# Patient Record
Sex: Male | Born: 1970 | Race: White | Hispanic: No | Marital: Married | Smoking: Current every day smoker
Health system: Southern US, Community
[De-identification: ages and names within clinical notes are randomized; demographics above are authoritative.]

## PROBLEM LIST (undated history)

## (undated) DIAGNOSIS — M950 Acquired deformity of nose: Secondary | ICD-10-CM

## (undated) DIAGNOSIS — J343 Hypertrophy of nasal turbinates: Secondary | ICD-10-CM

## (undated) DIAGNOSIS — J45909 Unspecified asthma, uncomplicated: Secondary | ICD-10-CM

## (undated) DIAGNOSIS — J342 Deviated nasal septum: Secondary | ICD-10-CM

## (undated) DIAGNOSIS — K219 Gastro-esophageal reflux disease without esophagitis: Secondary | ICD-10-CM

## (undated) DIAGNOSIS — I10 Essential (primary) hypertension: Secondary | ICD-10-CM

## (undated) DIAGNOSIS — Z98811 Dental restoration status: Secondary | ICD-10-CM

## (undated) HISTORY — PX: WISDOM TOOTH EXTRACTION: SHX21

## (undated) HISTORY — DX: Gastro-esophageal reflux disease without esophagitis: K21.9

---

## 2002-07-10 ENCOUNTER — Encounter: Payer: Self-pay | Admitting: Family Medicine

## 2002-07-10 ENCOUNTER — Ambulatory Visit (HOSPITAL_COMMUNITY): Admission: RE | Admit: 2002-07-10 | Discharge: 2002-07-10 | Payer: Self-pay | Admitting: Family Medicine

## 2003-12-11 ENCOUNTER — Ambulatory Visit (HOSPITAL_COMMUNITY): Admission: RE | Admit: 2003-12-11 | Discharge: 2003-12-11 | Payer: Self-pay | Admitting: Family Medicine

## 2010-09-17 ENCOUNTER — Encounter: Payer: Self-pay | Admitting: *Deleted

## 2010-09-17 ENCOUNTER — Inpatient Hospital Stay (HOSPITAL_COMMUNITY)
Admission: EM | Admit: 2010-09-17 | Discharge: 2010-09-18 | DRG: 684 | Disposition: A | Discharge status: Home / Self Care | Payer: 59 | Attending: Internal Medicine | Admitting: Internal Medicine

## 2010-09-17 DIAGNOSIS — T46905A Adverse effect of unspecified agents primarily affecting the cardiovascular system, initial encounter: Secondary | ICD-10-CM | POA: Diagnosis present

## 2010-09-17 DIAGNOSIS — N179 Acute kidney failure, unspecified: Principal | ICD-10-CM | POA: Diagnosis present

## 2010-09-17 DIAGNOSIS — I10 Essential (primary) hypertension: Secondary | ICD-10-CM | POA: Diagnosis present

## 2010-09-17 DIAGNOSIS — N289 Disorder of kidney and ureter, unspecified: Secondary | ICD-10-CM

## 2010-09-17 DIAGNOSIS — E860 Dehydration: Secondary | ICD-10-CM | POA: Diagnosis present

## 2010-09-17 HISTORY — DX: Essential (primary) hypertension: I10

## 2010-09-17 LAB — POCT I-STAT, CHEM 8
BUN: 53 mg/dL — ABNORMAL HIGH (ref 6–23)
Calcium, Ion: 1.08 mmol/L — ABNORMAL LOW (ref 1.12–1.32)
Chloride: 109 mEq/L (ref 96–112)
Creatinine, Ser: 4.5 mg/dL — ABNORMAL HIGH (ref 0.50–1.35)
Glucose, Bld: 101 mg/dL — ABNORMAL HIGH (ref 70–99)
HCT: 50 % (ref 39.0–52.0)
Hemoglobin: 17 g/dL (ref 13.0–17.0)
Potassium: 4.6 mEq/L (ref 3.5–5.1)
Sodium: 139 mEq/L (ref 135–145)
TCO2: 24 mmol/L (ref 0–100)

## 2010-09-17 MED ORDER — ONDANSETRON HCL 4 MG/2ML IJ SOLN
4.0000 mg | Freq: Once | INTRAMUSCULAR | Status: AC
  Administered 2010-09-17: 4 mg via INTRAVENOUS
  Filled 2010-09-17: qty 2

## 2010-09-17 MED ORDER — SODIUM CHLORIDE 0.9 % IV BOLUS (SEPSIS)
1000.0000 mL | Freq: Once | INTRAVENOUS | Status: AC
  Administered 2010-09-17: 1000 mL via INTRAVENOUS

## 2010-09-17 MED ORDER — SODIUM CHLORIDE 0.9 % IV BOLUS (SEPSIS)
1000.0000 mL | Freq: Once | INTRAVENOUS | Status: AC
  Administered 2010-09-18: 1000 mL via INTRAVENOUS

## 2010-09-17 MED ORDER — SODIUM CHLORIDE 0.9 % IV SOLN
Freq: Once | INTRAVENOUS | Status: AC
  Administered 2010-09-18: via INTRAVENOUS

## 2010-09-17 NOTE — ED Notes (Signed)
Pt c/o working in the heat today. Pt c/o sweating profusely, nausea/vomiting all day, and cramping in arms in hands.

## 2010-09-17 NOTE — ED Provider Notes (Signed)
History     Chief Complaint  Patient presents with  . 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 Persistent vomiting with po intake. +cramping pain all over his body. No diarrhea or abd pain.  Patient is a 40 y.o. male presenting with vomiting. The history is provided by the patient.  Emesis  This is a new problem. The current episode started 3 to 5 hours ago. The problem has not changed since onset.The emesis has an appearance of stomach contents (water). There has been no fever. Associated symptoms include myalgias and sweats. Pertinent negatives include no abdominal pain, no chills, no cough, no diarrhea, no fever and no headaches. Risk factors: working outside in high temperatures, new job for pt, he is not used to being in the heat.    Past Medical History  Diagnosis Date  . Hypertension     History reviewed. No pertinent past surgical history.  Family History  Problem Relation Age of Onset  . Hypertension Father     History  Substance Use Topics  . Smoking status: Current Everyday Smoker  . Smokeless tobacco: Not on file  . Alcohol Use: Yes     occ      Review of Systems  Constitutional: Negative for fever and chills.  HENT: Negative for neck pain and neck stiffness.   Eyes: Negative for pain.  Respiratory: Negative for cough and shortness of breath.   Cardiovascular: Positive for chest pain.  Gastrointestinal: Positive for vomiting. Negative for abdominal pain and diarrhea.  Genitourinary: Negative for dysuria.  Musculoskeletal: Positive for myalgias. Negative for back pain.  Skin: Negative for rash.  Neurological: Negative for headaches.  All other systems reviewed and are negative.    Physical Exam  BP 95/63  Pulse 102  Temp(Src) 97.6 F (36.4 C) (Oral)  Resp 20  Ht 6\' 1"  (1.854 m)  Wt 238 lb (107.956 kg)  BMI 31.40  kg/m2  SpO2 97%  Physical Exam  Constitutional: He is oriented to person, place, and time. He appears well-developed and well-nourished.  HENT:  Head: Normocephalic and atraumatic.  Mouth/Throat: Mucous membranes are dry.  Eyes: Conjunctivae and EOM are normal. Pupils are equal, round, and reactive to light.  Neck: Trachea normal. Neck supple. No thyromegaly present.  Cardiovascular: Normal rate, regular rhythm, S1 normal, S2 normal and normal pulses.     No systolic murmur is present   No diastolic murmur is present  Pulses:      Radial pulses are 2+ on the right side, and 2+ on the left side.  Pulmonary/Chest: Effort normal and breath sounds normal. He has no wheezes. He has no rhonchi. He has no rales. He exhibits no tenderness.  Abdominal: Soft. Normal appearance and bowel sounds are normal. There is no tenderness. There is no CVA tenderness and negative Murphy's sign.  Musculoskeletal:       BLE:s Calves nontender, no cords or erythema, negative Homans sign  Neurological: He is alert and oriented to person, place, and time. He has normal strength. No cranial nerve deficit or sensory deficit. GCS eye subscore is 4. GCS verbal subscore is 5. GCS motor subscore is 6.  Skin: Skin is warm and dry. No rash noted. He is not diaphoretic.  Psychiatric: His speech is normal.       Cooperative and appropriate    ED Course  Procedures  MDM Presentation c/w heat exhaustion.   crt sig elevated, IVFs in ED, nausea resolved with zofran and tolerates PO.  MED c/s for admit, cont IVFs  I personally performed the services described in this documentation, which was scribed in my presence. The recorded information has been reviewed and considered.    Written by Enos Fling acting as scribe for Sunnie Nielsen, MD.   Sunnie Nielsen, MD 09/18/10 [phone]

## 2010-09-18 ENCOUNTER — Encounter (HOSPITAL_COMMUNITY): Payer: Self-pay | Admitting: *Deleted

## 2010-09-18 DIAGNOSIS — N179 Acute kidney failure, unspecified: Secondary | ICD-10-CM | POA: Diagnosis present

## 2010-09-18 DIAGNOSIS — E860 Dehydration: Secondary | ICD-10-CM | POA: Diagnosis present

## 2010-09-18 LAB — BASIC METABOLIC PANEL
BUN: 44 mg/dL — ABNORMAL HIGH (ref 6–23)
CO2: 26 mEq/L (ref 19–32)
Calcium: 9.2 mg/dL (ref 8.4–10.5)
Chloride: 102 mEq/L (ref 96–112)
Creatinine, Ser: 3.2 mg/dL — ABNORMAL HIGH (ref 0.50–1.35)
GFR calc Af Amer: 27 mL/min — ABNORMAL LOW (ref >60)
GFR calc non Af Amer: 22 mL/min — ABNORMAL LOW (ref >60)
Glucose, Bld: 114 mg/dL — ABNORMAL HIGH (ref 70–99)
Potassium: 3.9 mEq/L (ref 3.5–5.1)
Sodium: 139 mEq/L (ref 135–145)

## 2010-09-18 LAB — URINALYSIS, ROUTINE W REFLEX MICROSCOPIC
Glucose, UA: NEGATIVE mg/dL
Leukocytes, UA: NEGATIVE
Nitrite: NEGATIVE
Protein, ur: >300 mg/dL — AB
Specific Gravity, Urine: >1.03 — ABNORMAL HIGH (ref 1.005–1.030)
Urobilinogen, UA: 0.2 mg/dL (ref 0.0–1.0)
pH: 5 (ref 5.0–8.0)

## 2010-09-18 LAB — CBC
HCT: 39.5 % (ref 39.0–52.0)
HCT: 43.6 % (ref 39.0–52.0)
Hemoglobin: 13.4 g/dL (ref 13.0–17.0)
Hemoglobin: 14.9 g/dL (ref 13.0–17.0)
MCH: 29.7 pg (ref 26.0–34.0)
MCH: 29.9 pg (ref 26.0–34.0)
MCHC: 33.9 g/dL (ref 30.0–36.0)
MCHC: 34.2 g/dL (ref 30.0–36.0)
MCV: 87 fL (ref 78.0–100.0)
MCV: 88.2 fL (ref 78.0–100.0)
Platelets: 187 10*3/uL (ref 150–400)
Platelets: 222 10*3/uL (ref 150–400)
RBC: 4.48 MIL/uL (ref 4.22–5.81)
RBC: 5.01 MIL/uL (ref 4.22–5.81)
RDW: 13.1 % (ref 11.5–15.5)
RDW: 13.2 % (ref 11.5–15.5)
WBC: 11.5 10*3/uL — ABNORMAL HIGH (ref 4.0–10.5)
WBC: 16.1 10*3/uL — ABNORMAL HIGH (ref 4.0–10.5)

## 2010-09-18 LAB — URINE MICROSCOPIC-ADD ON

## 2010-09-18 LAB — CARDIAC PANEL(CRET KIN+CKTOT+MB+TROPI)
CK, MB: 4.9 ng/mL — ABNORMAL HIGH (ref 0.3–4.0)
Relative Index: 1.6 (ref 0.0–2.5)
Total CK: 303 U/L — ABNORMAL HIGH (ref 7–232)
Troponin I: <0.3 ng/mL (ref <0.30)

## 2010-09-18 LAB — COMPREHENSIVE METABOLIC PANEL
BUN: 37 mg/dL — ABNORMAL HIGH (ref 6–23)
CO2: 25 mEq/L (ref 19–32)
Chloride: 104 mEq/L (ref 96–112)
Creatinine, Ser: 2.29 mg/dL — ABNORMAL HIGH (ref 0.50–1.35)
GFR calc non Af Amer: 32 mL/min — ABNORMAL LOW (ref >60)
Total Bilirubin: 0.4 mg/dL (ref 0.3–1.2)

## 2010-09-18 MED ORDER — SODIUM CHLORIDE 0.9 % IV SOLN
INTRAVENOUS | Status: DC
  Administered 2010-09-18: 11:00:00 via INTRAVENOUS

## 2010-09-18 MED ORDER — SODIUM CHLORIDE 0.9 % IV BOLUS (SEPSIS)
1000.0000 mL | Freq: Once | INTRAVENOUS | Status: AC
  Administered 2010-09-18: 1000 mL via INTRAVENOUS

## 2010-09-18 MED ORDER — SODIUM CHLORIDE 0.9 % IV SOLN
INTRAVENOUS | Status: DC
  Administered 2010-09-18: 03:00:00 via INTRAVENOUS

## 2010-09-18 MED ORDER — PROMETHAZINE HCL 12.5 MG PO TABS: 12.5000 mg | ORAL_TABLET | Freq: Four times a day (QID) | ORAL | Status: DC | PRN

## 2010-09-18 MED ORDER — PROMETHAZINE HCL 25 MG/ML IJ SOLN: 12.5000 mg | Freq: Four times a day (QID) | INTRAMUSCULAR | Status: DC | PRN

## 2010-09-18 MED ORDER — SODIUM CHLORIDE 0.9 % IV SOLN: Freq: Once | INTRAVENOUS | Status: DC

## 2010-09-18 NOTE — H&P (Signed)
[name], [name]              ACCOUNT NO.:  [id number]  MEDICAL RECORD NO.:  [id number]  LOCATION:  A305                          FACILITY:  [facility]  PHYSICIAN:  Tarry Kos, MD       DATE OF BIRTH:  [birth year]  DATE OF ADMISSION:  09/17/2010 DATE OF DISCHARGE:  LH                             HISTORY & PHYSICAL   CHIEF COMPLAINT:  Nausea and vomiting.  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  He came in because earlier this afternoon he has had a significant amount of nausea and vomiting and severe muscle cramps.  He says he has been profusely sweating and over Raymond last several days he has had hardly any urinary output.  He is found to be in acute renal failure and we are being asked to admit [name] because of his renal failure.  He does not have a history of renal failure.  He has not been running any fevers.  He is starting to urinate a little bit, he is on his third liter IV fluids bolus right now in [place] emergency department.  He states he feels much better since he has gotten some fluid, however, his creatinine was 4.5 and obviously due to significant of his renal failure, he will need to be hospitalized probably for at least 24-48 hours.  REVIEW OF SYSTEMS:  Otherwise negative.  PAST MEDICAL HISTORY:  Hypertension.  MEDICATIONS:  Lisinopril.  ALLERGIES:  ASPIRIN and PENICILLIN.  SOCIAL HISTORY:  Negative x3.  PHYSICAL EXAMINATION:  VITAL SIGNS:  Temperature is 97.6, pulse is 89, respiratory rate 20, blood pressure 111/48, and 96% O2 sats on room air. GENERAL:  He is alert and oriented x4.  No apparent stress, cooperative and friendly. HEENT:  Extraocular muscles are intact.  Pupils equal and react to light.  Oropharynx clear.  Mucous membranes  moist. NECK:  No JVD.  No carotid bruits. HEART:  Regular rate and rhythm without murmurs or gallops. CHEST:  Clear to auscultation bilaterally.  No wheezes or rales. ABDOMEN:  Soft, nontender, nondistended.  Positive bowel sounds.  No hepatosplenomegaly. EXTREMITIES:  No clubbing, cyanosis, or edema. PSYCH:  Normal affect. NEURO:  No focal neurologic deficits. SKIN:  No rashes.  LABORATORY DATA:  His BUN and creatinine significantly elevated at 53 and 4.5.  There is no previous one to compare.  Potassium is 4.6, white count is 16.1, hemoglobin is 14.9, platelet count 222.  Urinalysis with a high specific gravity over 1.030 and protein negative for leuks, negative for nitrites, and many bacteria.  ASSESSMENT AND PLAN:  This is a 40 year old male with significant dehydration and acute renal failure. 1. Acute renal failure secondary to prerenal azotemia from combination     of significant dehydration along with medication effects with ACE     inhibitor.  I am going to hold his lisinopril.  I am going to bolus     with total 4 L of IV  fluids of normal saline and then place him on     125 mL an hour and repeat his BUN and creatinine in Raymond morning.     Monitor his urinary output closely.  I suspect he will have     significant improvement in his renal functions in Raymond next 24     hours.  I have advised him that he will likely be here for at least     48 hours depending on what his creatinine does, he understands     this.  I have also explained Raymond extreme importance of staying     hydrated with fluids while working out in [place].  He understands     this, I am also going to add monitor his total CKs.  There has not     been one done yet in Raymond ED to check for any significant     rhabdomyolysis.  [name] is a full code.  Further     recommendation pending over hospital course.                                           ______________________________ Tarry Kos,  MD     RD/MEDQ  D:  09/18/2010  T:  09/18/2010  Job:  [id number]

## 2010-09-18 NOTE — ED Notes (Signed)
Patient's eating dinner brought by wife.  Patient states he feels better; awaiting admission/bed assignment.

## 2010-09-18 NOTE — Progress Notes (Signed)
UR Chart Review Completed  

## 2010-09-18 NOTE — Progress Notes (Signed)
[facility] SURGICAL UNIT [address] [place] [address]  September 18, 2010  Patient: [name]  Date of Birth: 30-Jun-1970  Date of Visit: 09/17/2010    To Whom It May Concern:  Chadrick Sprinkle was seen and treated in our emergency department on 09/17/2010. His wife, lakyn, mantione to take off work on 09/17/10 to bring Treydon Henricks to the Hospital.   If you have any questions or concerns, please don't hesitate to call.  Sincerely,

## 2010-09-18 NOTE — ED Notes (Signed)
Patient lying on stretcher with eyes closed; denies pain at this time.

## 2010-09-18 NOTE — Discharge Summary (Signed)
Physician Discharge Summary  Patient ID: [name] MRN: [id number] DOB/AGE: [birth year] 38 y.o. Primary Care Physician:MCGOUGH,WILLIAM M, MD Admit date: 09/17/2010 Discharge date: 09/18/2010    Discharge Diagnoses:  1.Dehydration/ARF,improving.    Current Discharge Medication List    CONTINUE these medications which have NOT CHANGED   Details  ALPRAZolam (XANAX) 1 MG tablet Take 1 mg by mouth 2 (two) times daily as needed. anxiety     amitriptyline-chlordiazePOXIDE (LIMBITROL) 5-12.5 MG per tablet Take 1 tablet by mouth daily.      aspirin 81 MG EC tablet Take 81 mg by mouth daily.      Multiple Vitamin (MULTIVITAMIN) tablet Take 1 tablet by mouth daily. otc     Omeprazole Magnesium 20.6 (20 BASE) MG CPDR Take 2 capsules by mouth daily. otc     albuterol (PROAIR HFA) 108 (90 BASE) MCG/ACT inhaler Inhale 2 puffs into the lungs every 6 (six) hours as needed. For shortness of breath       STOP taking these medications     diclofenac (VOLTAREN) 75 MG EC tablet      lisinopril (PRINIVIL,ZESTRIL) 10 MG tablet         Discharged Condition:Stable,improved    Consults:None  Significant Diagnostic Studies: No results found.  Lab Results: Results for orders placed during the hospital encounter of 09/17/10 (from the past 24 hour(s))  POCT I-STAT, CHEM 8     Status: Abnormal   Collection Time   09/17/10 10:44 PM      Component Value Range   Sodium 139  135 - 145 (mEq/L)   Potassium 4.6  3.5 - 5.1 (mEq/L)   Chloride 109  96 - 112 (mEq/L)   BUN 53 (*) 6 - 23 (mg/dL)   Creatinine, Ser 4.09 (*) 0.50 - 1.35 (mg/dL)   Glucose, Bld 811 (*) 70 - 99 (mg/dL)   Calcium, Ion 9.14 (*) 1.12 - 1.32 (mmol/L)   TCO2 24  0 - 100 (mmol/L)   Hemoglobin 17.0  13.0 - 17.0 (g/dL)   HCT 78.2  95.6 - 21.3 (%)  URINALYSIS, ROUTINE W REFLEX MICROSCOPIC     Status: Abnormal   Collection Time   09/18/10 12:11 AM      Component Value Range   Color, Urine YELLOW  YELLOW    Appearance  HAZY (*) CLEAR    Specific Gravity, Urine >1.030 (*) 1.005 - 1.030    pH 5.0  5.0 - 8.0    Glucose, UA NEGATIVE  NEGATIVE (mg/dL)   Hgb urine dipstick TRACE (*) NEGATIVE    Bilirubin Urine SMALL (*) NEGATIVE    Ketones, ur TRACE (*) NEGATIVE (mg/dL)   Protein, ur >086 (*) NEGATIVE (mg/dL)   Urobilinogen, UA 0.2  0.0 - 1.0 (mg/dL)   Nitrite NEGATIVE  NEGATIVE    Leukocytes, UA NEGATIVE  NEGATIVE   URINE MICROSCOPIC-ADD ON     Status: Abnormal   Collection Time   09/18/10 12:11 AM      Component Value Range   Squamous Epithelial / LPF RARE  RARE    WBC, UA 3-6  <3 (WBC/hpf)   RBC / HPF 3-6  <3 (RBC/hpf)   Bacteria, UA MANY (*) RARE   CBC     Status: Abnormal   Collection Time   09/18/10 12:14 AM      Component Value Range   WBC 16.1 (*) 4.0 - 10.5 (K/uL)   RBC 5.01  4.22 - 5.81 (MIL/uL)   Hemoglobin 14.9  13.0 - 17.0 (g/dL)  HCT 43.6  39.0 - 52.0 (%)   MCV 87.0  78.0 - 100.0 (fL)   MCH 29.7  26.0 - 34.0 (pg)   MCHC 34.2  30.0 - 36.0 (g/dL)   RDW 56.2  13.0 - 86.5 (%)   Platelets 222  150 - 400 (K/uL)  BASIC METABOLIC PANEL     Status: Abnormal   Collection Time   09/18/10  4:49 AM      Component Value Range   Sodium 139  135 - 145 (mEq/L)   Potassium 3.9  3.5 - 5.1 (mEq/L)   Chloride 102  96 - 112 (mEq/L)   CO2 26  19 - 32 (mEq/L)   Glucose, Bld 114 (*) 70 - 99 (mg/dL)   BUN 44 (*) 6 - 23 (mg/dL)   Creatinine, Ser 7.84 (*) 0.50 - 1.35 (mg/dL)   Calcium 9.2  8.4 - 69.6 (mg/dL)   GFR calc non Af Amer 22 (*) >60 (mL/min)   GFR calc Af Amer 27 (*) >60 (mL/min)  CARDIAC PANEL(CRET KIN+CKTOT+MB+TROPI)     Status: Abnormal   Collection Time   09/18/10  5:12 AM      Component Value Range   Total CK 303 (*) 7 - 232 (U/L)   CK, MB 4.9 (*) 0.3 - 4.0 (ng/mL)   Troponin I <0.30  <0.30 (ng/mL)   Relative Index 1.6  0.0 - 2.5   CBC     Status: Abnormal   Collection Time   09/18/10 11:43 AM      Component Value Range   WBC 11.5 (*) 4.0 - 10.5 (K/uL)   RBC 4.48  4.22 - 5.81  (MIL/uL)   Hemoglobin 13.4  13.0 - 17.0 (g/dL)   HCT 29.5  28.4 - 13.2 (%)   MCV 88.2  78.0 - 100.0 (fL)   MCH 29.9  26.0 - 34.0 (pg)   MCHC 33.9  30.0 - 36.0 (g/dL)   RDW 44.0  10.2 - 72.5 (%)   Platelets 187  150 - 400 (K/uL)  COMPREHENSIVE METABOLIC PANEL     Status: Abnormal   Collection Time   09/18/10 11:43 AM      Component Value Range   Sodium 138  135 - 145 (mEq/L)   Potassium 3.9  3.5 - 5.1 (mEq/L)   Chloride 104  96 - 112 (mEq/L)   CO2 25  19 - 32 (mEq/L)   Glucose, Bld 86  70 - 99 (mg/dL)   BUN 37 (*) 6 - 23 (mg/dL)   Creatinine, Ser 3.66 (*) 0.50 - 1.35 (mg/dL)   Calcium 8.2 (*) 8.4 - 10.5 (mg/dL)   Total Protein 6.6  6.0 - 8.3 (g/dL)   Albumin 3.4 (*) 3.5 - 5.2 (g/dL)   AST 19  0 - 37 (U/L)   ALT 23  0 - 53 (U/L)   Alkaline Phosphatase 62  39 - 117 (U/L)   Total Bilirubin 0.4  0.3 - 1.2 (mg/dL)   GFR calc non Af Amer 32 (*) >60 (mL/min)   GFR calc Af Amer 39 (*) >60 (mL/min)   No results found for this or any previous visit (from the past 240 hour(s)).   Hospital Course: Admitted with acute dehydration with n/v.Please see H&P.Given aggressive iv fluids with NSaline.Labs and clinical condition improved.  Discharge Exam: Blood pressure 106/61, pulse 67, temperature 97.9 F (36.6 C), temperature source Oral, resp. rate 20, height 6\' 1"  (1.854 m), weight 105 kg (231 lb 7.7 oz), SpO2 99.00%. Systemically well.CVS within  normal limits.Resp:lungs clear.RUE:AVWUJ/[id number],FA focal signs.  Disposition: Home.Renal function not back to normal.Encouraged po intake,hold Lisinopril until he sees PCP in 1 week when his renal fuction needs to be checked.  Discharge Orders    Future Orders Please Complete By Expires   Diet - low sodium heart healthy      Increase activity slowly         Follow-up Information    Follow up with [facility] M. Make an appointment in 1 week.   Contact information:   [address] [place]  [address] [phone]          Signed: Wilson Singer 09/18/2010, 1:36 PM

## 2010-09-18 NOTE — Progress Notes (Signed)
[facility] SURGICAL UNIT [address] [place] [address]  September 18, 2010  Patient: ALA CAPRI  Date of Birth: 1970-10-24  Date of Visit: 09/17/2010    To Whom It May Concern:  Adithya Difrancesco was seen and treated in our emergency department on 09/17/2010. He can return to work on 09/22/10. If you have any questions or concerns, please don't hesitate to call.  Sincerely,

## 2010-09-18 NOTE — Progress Notes (Signed)
Subjective: This 40 year old man was admitted with severe dehydration from lack of fluids. He has so far had 1 L of bolus normal saline and then normal saline at 125 cc an hour. He feels much improved but his renal function is not back to normal. He normally is on lisinopril and he had his lab work checked approximately 3-4 months ago and he was not told that it was abnormal. He has no nausea vomiting now.             Physical Exam: The vital signs ZOX:WRUE:  [97.6 F (36.4 C)-97.9 F (36.6 C)] 97.9 F (36.6 C) (07/19 0600) Pulse Rate:  [67-102] 67  (07/19 0600) Resp:  [14-20] 20  (07/19 0600) BP: (95-137)/(48-65) 106/61 mmHg (07/19 0600) SpO2:  [14 %-99 %] 99 % (07/19 0600) Weight:  [105 kg (231 lb 7.7 oz)-107.956 kg (238 lb)] 231 lb 7.7 oz (105 kg) (07/19 0200) He looks systemically well. Heart sounds are present and normal. Lung fields are clear. Abdomen is soft nontender. He is alert and oriented without any focal neurological signs.   Investigations: Results for orders placed during the hospital encounter of 09/17/10 (from the past 24 hour(s))  POCT I-STAT, CHEM 8     Status: Abnormal   Collection Time   09/17/10 10:44 PM      Component Value Range   Sodium 139  135 - 145 (mEq/L)   Potassium 4.6  3.5 - 5.1 (mEq/L)   Chloride 109  96 - 112 (mEq/L)   BUN 53 (*) 6 - 23 (mg/dL)   Creatinine, Ser 4.54 (*) 0.50 - 1.35 (mg/dL)   Glucose, Bld 098 (*) 70 - 99 (mg/dL)   Calcium, Ion 1.19 (*) 1.12 - 1.32 (mmol/L)   TCO2 24  0 - 100 (mmol/L)   Hemoglobin 17.0  13.0 - 17.0 (g/dL)   HCT 14.7  82.9 - 56.2 (%)  URINALYSIS, ROUTINE W REFLEX MICROSCOPIC     Status: Abnormal   Collection Time   09/18/10 12:11 AM      Component Value Range   Color, Urine YELLOW  YELLOW    Appearance HAZY (*) CLEAR    Specific Gravity, Urine >1.030 (*) 1.005 - 1.030    pH 5.0  5.0 - 8.0    Glucose, UA NEGATIVE  NEGATIVE (mg/dL)   Hgb urine dipstick TRACE (*) NEGATIVE    Bilirubin Urine SMALL (*)  NEGATIVE    Ketones, ur TRACE (*) NEGATIVE (mg/dL)   Protein, ur >130 (*) NEGATIVE (mg/dL)   Urobilinogen, UA 0.2  0.0 - 1.0 (mg/dL)   Nitrite NEGATIVE  NEGATIVE    Leukocytes, UA NEGATIVE  NEGATIVE   URINE MICROSCOPIC-ADD ON     Status: Abnormal   Collection Time   09/18/10 12:11 AM      Component Value Range   Squamous Epithelial / LPF RARE  RARE    WBC, UA 3-6  <3 (WBC/hpf)   RBC / HPF 3-6  <3 (RBC/hpf)   Bacteria, UA MANY (*) RARE   CBC     Status: Abnormal   Collection Time   09/18/10 12:14 AM      Component Value Range   WBC 16.1 (*) 4.0 - 10.5 (K/uL)   RBC 5.01  4.22 - 5.81 (MIL/uL)   Hemoglobin 14.9  13.0 - 17.0 (g/dL)   HCT 86.5  78.4 - 69.6 (%)   MCV 87.0  78.0 - 100.0 (fL)   MCH 29.7  26.0 - 34.0 (pg)   MCHC  34.2  30.0 - 36.0 (g/dL)   RDW 16.1  09.6 - 04.5 (%)   Platelets 222  150 - 400 (K/uL)  BASIC METABOLIC PANEL     Status: Abnormal   Collection Time   09/18/10  4:49 AM      Component Value Range   Sodium 139  135 - 145 (mEq/L)   Potassium 3.9  3.5 - 5.1 (mEq/L)   Chloride 102  96 - 112 (mEq/L)   CO2 26  19 - 32 (mEq/L)   Glucose, Bld 114 (*) 70 - 99 (mg/dL)   BUN 44 (*) 6 - 23 (mg/dL)   Creatinine, Ser 4.09 (*) 0.50 - 1.35 (mg/dL)   Calcium 9.2  8.4 - 81.1 (mg/dL)   GFR calc non Af Amer 22 (*) >60 (mL/min)   GFR calc Af Amer 27 (*) >60 (mL/min)  CARDIAC PANEL(CRET KIN+CKTOT+MB+TROPI)     Status: Abnormal   Collection Time   09/18/10  5:12 AM      Component Value Range   Total CK 303 (*) 7 - 232 (U/L)   CK, MB 4.9 (*) 0.3 - 4.0 (ng/mL)   Troponin I <0.30  <0.30 (ng/mL)   Relative Index 1.6  0.0 - 2.5    No results found for this or any previous visit (from the past 240 hour(s)).     Medications: I have reviewed the patient's current medications.  Impression: 1. Acute dehydration/acute renal failure. 2. Hypertension on ACE inhibitor.      Plan: 1. Give further 2 boluses of normal saline of 1 L each. 2. Check electrolytes and CBC later on  today. 3. If renal function is improving, he can likely be discharged today.     LOS: 1 day   GOSRANI,NIMISH C 09/18/2010, 8:06 AM

## 2010-09-18 NOTE — H&P (Signed)
  Job [id number]

## 2010-10-30 NOTE — Progress Notes (Signed)
Encounter addended by: Clarene Critchley on: 10/30/2010  6:40 AM<BR>     Documentation filed: Flowsheet VN

## 2014-05-05 ENCOUNTER — Emergency Department (HOSPITAL_COMMUNITY)
Admission: EM | Admit: 2014-05-05 | Discharge: 2014-05-05 | Disposition: A | Discharge status: Home / Self Care | Payer: 59 | Attending: Emergency Medicine | Admitting: Emergency Medicine

## 2014-05-05 ENCOUNTER — Emergency Department (HOSPITAL_COMMUNITY): Payer: 59

## 2014-05-05 ENCOUNTER — Encounter (HOSPITAL_COMMUNITY): Payer: Self-pay | Admitting: Nurse Practitioner

## 2014-05-05 DIAGNOSIS — Z880 Allergy status to penicillin: Secondary | ICD-10-CM | POA: Insufficient documentation

## 2014-05-05 DIAGNOSIS — I10 Essential (primary) hypertension: Secondary | ICD-10-CM | POA: Diagnosis not present

## 2014-05-05 DIAGNOSIS — F419 Anxiety disorder, unspecified: Secondary | ICD-10-CM | POA: Insufficient documentation

## 2014-05-05 DIAGNOSIS — F22 Delusional disorders: Secondary | ICD-10-CM | POA: Insufficient documentation

## 2014-05-05 DIAGNOSIS — Z720 Tobacco use: Secondary | ICD-10-CM | POA: Insufficient documentation

## 2014-05-05 DIAGNOSIS — Z79899 Other long term (current) drug therapy: Secondary | ICD-10-CM | POA: Insufficient documentation

## 2014-05-05 DIAGNOSIS — Z791 Long term (current) use of non-steroidal anti-inflammatories (NSAID): Secondary | ICD-10-CM | POA: Insufficient documentation

## 2014-05-05 DIAGNOSIS — Z7982 Long term (current) use of aspirin: Secondary | ICD-10-CM | POA: Insufficient documentation

## 2014-05-05 LAB — COMPREHENSIVE METABOLIC PANEL
ALBUMIN: 3.9 g/dL (ref 3.5–5.2)
ALK PHOS: 42 U/L (ref 39–117)
ALT: 36 U/L (ref 0–53)
ANION GAP: 8 (ref 5–15)
AST: 36 U/L (ref 0–37)
BUN: 22 mg/dL (ref 6–23)
CALCIUM: 9 mg/dL (ref 8.4–10.5)
CHLORIDE: 107 mmol/L (ref 96–112)
CO2: 27 mmol/L (ref 19–32)
Creatinine, Ser: 1.18 mg/dL (ref 0.50–1.35)
GFR calc Af Amer: 87 mL/min — ABNORMAL LOW (ref >90)
GFR, EST NON AFRICAN AMERICAN: 75 mL/min — AB (ref >90)
GLUCOSE: 91 mg/dL (ref 70–99)
POTASSIUM: 3.9 mmol/L (ref 3.5–5.1)
Sodium: 142 mmol/L (ref 135–145)
Total Bilirubin: 0.7 mg/dL (ref 0.3–1.2)
Total Protein: 6.6 g/dL (ref 6.0–8.3)

## 2014-05-05 LAB — CBC
HEMATOCRIT: 41.6 % (ref 39.0–52.0)
HEMOGLOBIN: 14.2 g/dL (ref 13.0–17.0)
MCH: 29.8 pg (ref 26.0–34.0)
MCHC: 34.1 g/dL (ref 30.0–36.0)
MCV: 87.2 fL (ref 78.0–100.0)
Platelets: 196 10*3/uL (ref 150–400)
RBC: 4.77 MIL/uL (ref 4.22–5.81)
RDW: 12.8 % (ref 11.5–15.5)
WBC: 7.6 10*3/uL (ref 4.0–10.5)

## 2014-05-05 LAB — SALICYLATE LEVEL

## 2014-05-05 LAB — RAPID URINE DRUG SCREEN, HOSP PERFORMED
AMPHETAMINES: NOT DETECTED
BARBITURATES: NOT DETECTED
Benzodiazepines: POSITIVE — AB
Cocaine: NOT DETECTED
Opiates: NOT DETECTED
Tetrahydrocannabinol: NOT DETECTED

## 2014-05-05 LAB — ACETAMINOPHEN LEVEL: Acetaminophen (Tylenol), Serum: <10 ug/mL — ABNORMAL LOW (ref 10–30)

## 2014-05-05 LAB — ETHANOL: Alcohol, Ethyl (B): <5 mg/dL (ref 0–9)

## 2014-05-05 NOTE — ED Notes (Addendum)
Per [facility] at [facility], the recommendation for pt is to discharge with outpatient resources and a no harm contract due to pt not meeting inpatient criteria. While discussing this with pt and his wife, they are frustrated that more cannot be done for them at this time, feeling that they spent a lot of time here with no solution. RN explained rationale to the recommendation and the benefits it presents seeing as how pt has a primary care appointment on Monday. Pt and wife still frustrated but agreed to the plan, pt signed the no harm contract and they departed cooperatively.

## 2014-05-05 NOTE — BH Assessment (Addendum)
Tele Assessment Note   [name] is an 44 y.o. male came into the [facility] tonight at the urging of a friend and his wife.  Pt reports that he believes that his wife is planning to kill him and his belief and distrust of his wife is greatly stressing their already stressed marriage.  Pt denies SI, HI, SH urges or AVH.  Pt stated that he has a history of panic attacks for which he has been prescribed Xanax by his Primary Care Physician since 1993.  Pt stated that in 1993, he could take 1/2 a pill and it would calm him.  He stated that now he takes 1-2 pills every day and "it doesn't touch me" was his comment indicating possible tolerance building. Pt described symptoms such as repeatedly checking locked doors, counting items and checking and rechecking other similar situations which meets many of the criteria for a diagnosis of OCD.  Pt stated that he is not "outgoing" and wishes he were.  Also, pt seemed focused on actions that his wife was taking that he is interpreting in a jealous, paranoid manner.  Pt stated he has told his wife that if he ever found out she had cheated on him he would kill her.  Pt stated that in reality he is much more passive and probably would never act on that threat.  Pt stated that recently his wife was told that she may lose her job soon and it has increased their stress and has increased pt's paranoia and suspicion. Pt stated that this stress is affecting his ability to work, his relationship with his children and wife and his self esteem.  Pt denies sexual, physical or emotional/verbal abuse in his background. Pt reported that in addition to the Xanax he has been taking, he regularly drinks alcohol.  Pt reported that he was drinking alcohol daily (3-6 beers) but has reduced the frequency of his drinking due to his wife's threats to leave him if he did not cut down. Pt stated that he has no history of aggressive behavior. Pt continued by saying that his parents sent him to private  school and he believes it made him passive and out of touch with the real world.  Pt is experiencing symptoms of depression including feelings of hopelessness, helplessness, worthlessness, anger/irritability and tendencies to isolate himself.  Pt was dressed in his street clothes and reclined on his hospital bed during the assessment.  Pt was alert, cooperative and pleasant during the assessment.  Pt mainatined fair eye contact and used gestures as he talked.  Pt's speech was coherent and relevant but his judgement and insight were impaired. Pt's mood was depressed and suspicious and his blunted affect was congruent.  Pt was oriented x 4.  Axis I: 311 Unspecified Depressive Disorder; R/O OCD; Panic Attack Disorder by hx Axis II: Deferred Axis III:  Past Medical History  Diagnosis Date  . Hypertension   . Anxiety   . Panic attack    Axis IV: other psychosocial or environmental problems, problems related to social environment and problems with primary support group Axis V: 21-30 behavior considerably influenced by delusions or hallucinations OR serious impairment in judgment, communication OR inability to function in almost all areas  Past Medical History:  Past Medical History  Diagnosis Date  . Hypertension   . Anxiety   . Panic attack     History reviewed. No pertinent past surgical history.  Family History:  Family History  Problem Relation Age of  Onset  . Hypertension Father     Social History:  reports that he has been smoking.  He does not have any smokeless tobacco history on file. He reports that he drinks alcohol. He reports that he does not use illicit drugs.  Additional Social History:  Alcohol / Drug Use Prescriptions: See PTA list History of alcohol / drug use?: Yes Longest period of sobriety (when/how long): 6 weeks Negative Consequences of Use: Personal relationships Substance #1 Name of Substance 1: Xanax (prescribed but at times using more than presribed and  building a tolerance) 1 - Age of First Use: 32 1 - Amount (size/oz): 1-2 pills 1 - Frequency: daily 1 - Duration: since 1993 and onset of panic attacks 1 - Last Use / Amount: today Substance #2 Name of Substance 2: Alcohol 2 - Age of First Use: 32 2 - Amount (size/oz): 3-6 beers 2 - Frequency: Most recently 2 times a week; was drinking daily until wife threatened to leave if he did not cur down 2 - Duration: since 44 yo 2 - Last Use / Amount: last weekend  CIWA: CIWA-Ar BP: 133/76 mmHg Pulse Rate: 63 COWS:    PATIENT STRENGTHS: (choose at least two) Average or above average intelligence Communication skills Supportive family/friends  Allergies:  Allergies  Allergen Reactions  . Aspirin     In high doses breaks out in hives  . Penicillins     Breaks out in hives    Home Medications:  (Not in a hospital admission)  OB/GYN Status:  No LMP for male patient.  General Assessment Data Location of Assessment: [facility] ED ACT Assessment:  (na) Is this a Tele or Face-to-Face Assessment?: Tele Assessment Is this an Initial Assessment or a Re-assessment for this encounter?: Initial Assessment Living Arrangements: Spouse/significant other, Children Can pt return to current living arrangement?: Yes Admission Status: Voluntary Is patient capable of signing voluntary admission?: Yes Transfer from: Home Referral Source: Self/Family/Friend  Medical Screening Exam [facility] Walk-in ONLY) Medical Exam completed: No Reason for MSE not completed: Other: (labs not complete)  [facility] Crisis Care Plan Living Arrangements: Spouse/significant other, Children Name of Psychiatrist: none Name of Therapist: none  Education Status Is patient currently in school?: No Current Grade: na Highest grade of school patient has completed: HS Name of school: na Contact person: na  Risk to self with the past 6 months Suicidal Ideation: No (denies) Suicidal Intent: No Is patient at risk for suicide?:  No Suicidal Plan?: No (denies) Access to Means: Yes (pt has access to firearms) Specify Access to Suicidal Means: access to guns What has been your use of drugs/alcohol within the last 12 months?: regular use Previous Attempts/Gestures: No (denies) How many times?: 0 Other Self Harm Risks:  (denies) Triggers for Past Attempts:  (na) Intentional Self Injurious Behavior: None Family Suicide History: Unknown Recent stressful life event(s): Job Loss (wife is in process of losing her job) Persecutory voices/beliefs?: Yes (pt believes that his wife is plotting to kill him) Depression: Yes Depression Symptoms: Insomnia, Isolating, Fatigue, Guilt, Loss of interest in usual pleasures, Feeling worthless/self pity, Feeling angry/irritable Substance abuse history and/or treatment for substance abuse?: No Suicide prevention information given to non-admitted patients: Not applicable  Risk to Others within the past 6 months Homicidal Ideation: No (denies) Thoughts of Harm to Others: No (denies HI or plans ) Current Homicidal Intent: No Current Homicidal Plan: No Access to Homicidal Means: Yes (access to firearms) Describe Access to Homicidal Means: guns Identified Victim: na  History of harm to others?: No (denies) Assessment of Violence: None Noted Violent Behavior Description: na Does patient have access to weapons?: Yes (Comment) Criminal Charges Pending?: No Does patient have a court date: No  Psychosis Hallucinations: None noted (denies) Delusions: Jealous (pt thinks wife is palnning to kill him and may be cheating)  Mental Status Report Appear/Hygiene: Unremarkable Eye Contact: Fair Motor Activity: Gestures, Restlessness Speech: Logical/coherent Level of Consciousness: Alert Mood: Depressed, Anxious, Suspicious Affect: Irritable, Depressed, Blunted Anxiety Level: Moderate Thought Processes: Coherent, Relevant Judgement: Impaired Orientation: Person, Place, Time,  Situation Obsessive Compulsive Thoughts/Behaviors:  (pt described classic OCD behaviors, relocking doors, countin)  Cognitive Functioning Concentration: Fair Memory: Unable to Assess IQ: Average Insight: Poor Impulse Control: Fair Appetite: Fair Weight Loss: 0 Weight Gain: 0 Sleep: No Change Total Hours of Sleep: 4 Vegetative Symptoms: Unable to Assess  ADLScreening [facility] Assessment Services) Patient's cognitive ability adequate to safely complete daily activities?: Yes Patient able to express need for assistance with ADLs?: Yes Independently performs ADLs?: Yes (appropriate for developmental age)  Prior Inpatient Therapy Prior Inpatient Therapy: No Prior Therapy Dates: na Prior Therapy Facilty/Provider(s): na Reason for Treatment: na  Prior Outpatient Therapy Prior Outpatient Therapy: No Prior Therapy Dates: na Prior Therapy Facilty/Provider(s): na Reason for Treatment: na  ADL Screening (condition at time of admission) Patient's cognitive ability adequate to safely complete daily activities?: Yes Patient able to express need for assistance with ADLs?: Yes Independently performs ADLs?: Yes (appropriate for developmental age)       Abuse/Neglect Assessment (Assessment to be complete while patient is alone) Physical Abuse: Denies Verbal Abuse: Denies Sexual Abuse: Denies Exploitation of patient/patient's resources: Denies     Merchant navy officerAdvance Directives (For Healthcare) Does patient have an advance directive?: No    Additional Information 1:1 In Past 12 Months?: No CIRT Risk: No Elopement Risk: No Does patient have medical clearance?: No     Disposition Initial Assessment Completed for this Encounter: Yes Disposition of Patient: Other dispositions (Pending reviwe with [facility] Ext[facility]) Other disposition(s): Other (Comment)  Per Hulan FessIjeoma Nwaeze, NP [facility]:  Does not meet IP criteria. No SI, HI, SH or AVH.  Recommend Discharging with recommendation to follow-up with  community psychiatrist and therapist to continue and expand treatment.  Also, ask to sign a "No Harm" Contract at discharge.  [facility]vised of [facility]. She agreed. Spoke to Kelly Servicesaryn, Charity fundraiserN at [facility]: Advised of plan.  Obtained fax number. Faxed her "No Harm" Sport and exercise psychologistcontract and Community Resources listing of Psychiatrists and [place].  Beryle FlockMary Alex Leahy, MS, [facility], [facility] Triage Specialist [facility] Jamoni Hewes T 05/05/2014 9:23 PM

## 2014-05-05 NOTE — ED Notes (Signed)
Wife states the patient has been paranoid this week and having thoughts that she is trying to hurt him. He has a past hx of anxiety and panic attacks but only takes xanax PRN for his symptoms. He denies SI, HI. His only physical complaint is that his mouth feels dry. He states he and his wife have been having personal issues and he suspects she may be trying to harm him.

## 2014-05-05 NOTE — Discharge Instructions (Signed)
Paranoia Paranoia is a distrust of others that is not based on a real reason for distrust. This may reach delusional levels. This means the paranoid person feels the world is against them when there is no reason to make them feel that way. People with paranoia feel as though people around them are "out to get them".  SIMILAR MENTAL ILNESSES  Depression is a feeling as though you are down all the time. It is normal in some situations where you have just lost a loved one. It is abnormal if you are having feelings of paranoia with it.  Dementia is a physical problem with the brain in which the brain no longer works properly. There are problems with daily activities of living. Alzheimer's disease is one example of this. Dementia is also caused by old age changes in the brain which come with the death of brain cells and small strokes.  Paranoidschizophrenia. People with paranoid schizophrenia and persecutory delusional disorder have delusions in which they feel people around them are plotting against them. Persecutory delusions in paranoid schizophrenia are bizarre, sometimes grandiose, and often accompanied by auditory hallucinations. This means the person is hearing voices that are not there.  Delusionaldisorder (persecutory type). Delusions experienced by individuals with delusional disorder are more believable than those experienced by paranoid schizophrenics; they are not bizarre, though still unjustified. Individuals with delusional disorder may seem offbeat or quirky rather than mentally ill, and therefore, may never seek treatment. All of these problems usually do not allow these people to interact socially in an acceptable manner. CAUSES The cause of paranoia is often not known. It is common in people with extended abuse of:  Cocaine.  Amphetamine.  Marijuana.  Alcohol. Sometimes there is an inherited tendency. It may be associated with stress or changes in brain chemistry. DIAGNOSIS    When paranoia is present, your caregiver may:  Refer you to a specialist.  Do a physical exam.  Perform other tests on you to make sure there are not other problems causing the paranoia including:  Physical problems.  Mental problems.  Chemical problems (other than drugs). Testing may be done to determine if there is a psychiatric disability present that can be treated with medicine. TREATMENT   Paranoia that is a symptom of a psychiatric problem should be treated by professionals.  Medicines are available which can help this disorder. Antipsychotic medicine may be prescribed by your caregiver.  Sometimes psychotherapy may be useful.  Conditions such as depression or drug abuse are treated individually. If the paranoia is caused by drug abuse, a treatment facility may be helpful. Depression may be helped by antidepressants. PROGNOSIS   Paranoid people are difficult to treat because of their belief that everyone is out to get them or harm them. Because of this mistrust, they often must be talked into entering treatment by a trusted family member or friend. They may not want to take medicine as they may see this as an attempt to poison them.  Gradual gains in the trust of a therapist or caregiver helps in a successful treatment plan.  Some people with PPD or persecutory delusional disorder function in society without treatment in limited fashion. Document Released: 02/19/2003 Document Revised: 05/11/2011 Document Reviewed: 10/25/2007 [facility] Patient Information 2015 [place]. This information is not intended to replace advice given to you by your health care provider. Make sure you discuss any questions you have with your health care provider.  Panic Attacks Panic attacks are sudden, short-livedsurges of severe  anxiety, fear, or discomfort. They may occur for no reason when you are relaxed, when you are anxious, or when you are sleeping. Panic attacks may occur for a number  of reasons:   Healthy people occasionally have panic attacks in extreme, life-threatening situations, such as war or natural disasters. Normal anxiety is a protective mechanism of the body that helps [place] react to danger (fight or flight response).  Panic attacks are often seen with anxiety disorders, such as panic disorder, social anxiety disorder, generalized anxiety disorder, and phobias. Anxiety disorders cause excessive or uncontrollable anxiety. They may interfere with your relationships or other life activities.  Panic attacks are sometimes seen with other mental illnesses, such as depression and posttraumatic stress disorder.  Certain medical conditions, prescription medicines, and drugs of abuse can cause panic attacks. SYMPTOMS  Panic attacks start suddenly, peak within 20 minutes, and are accompanied by four or more of the following symptoms:  Pounding heart or fast heart rate (palpitations).  Sweating.  Trembling or shaking.  Shortness of breath or feeling smothered.  Feeling choked.  Chest pain or discomfort.  Nausea or strange feeling in your stomach.  Dizziness, light-headedness, or feeling like you will faint.  Chills or hot flushes.  Numbness or tingling in your lips or hands and feet.  Feeling that things are not real or feeling that you are not yourself.  Fear of losing control or going crazy.  Fear of dying. Some of these symptoms can mimic serious medical conditions. For example, you may think you are having a heart attack. Although panic attacks can be very scary, they are not life threatening. DIAGNOSIS  Panic attacks are diagnosed through an assessment by your health care provider. Your health care provider will ask questions about your symptoms, such as where and when they occurred. Your health care provider will also ask about your medical history and use of alcohol and drugs, including prescription medicines. Your health care provider may order blood  tests or other studies to rule out a serious medical condition. Your health care provider may refer you to a mental health professional for further evaluation. TREATMENT   Most healthy people who have one or two panic attacks in an extreme, life-threatening situation will not require treatment.  The treatment for panic attacks associated with anxiety disorders or other mental illness typically involves counseling with a mental health professional, medicine, or a combination of both. Your health care provider will help determine what treatment is best for you.  Panic attacks due to physical illness usually go away with treatment of the illness. If prescription medicine is causing panic attacks, talk with your health care provider about stopping the medicine, decreasing the dose, or substituting another medicine.  Panic attacks due to alcohol or drug abuse go away with abstinence. Some adults need professional help in order to stop drinking or using drugs. HOME CARE INSTRUCTIONS   Take all medicines as directed by your health care provider.   Schedule and attend follow-up visits as directed by your health care provider. It is important to keep all your appointments. SEEK MEDICAL CARE IF:  You are not able to take your medicines as prescribed.  Your symptoms do not improve or get worse. SEEK IMMEDIATE MEDICAL CARE IF:   You experience panic attack symptoms that are different than your usual symptoms.  You have serious thoughts about hurting yourself or others.  You are taking medicine for panic attacks and have a serious side effect. MAKE SURE YOU:  Understand these instructions.  Will watch your condition.  Will get help right away if you are not doing well or get worse. Document Released: 02/16/2005 Document Revised: 02/21/2013 Document Reviewed: 09/30/2012 [facility] Patient Information 2015 [place], [place]. This information is not intended to replace advice given to you by your  health care provider. Make sure you discuss any questions you have with your health care provider.

## 2014-05-05 NOTE — ED Provider Notes (Signed)
CSN: [id number]     Arrival date & time 05/05/14  1447 History   First MD Initiated Contact with Patient 05/05/14 1753     Chief Complaint  Patient presents with  . Anxiety     (Consider location/radiation/quality/duration/timing/severity/associated sxs/prior Treatment) HPI Comments: 1 week of new-onset paranoia that wife is cheating on him and is plotting to kill him.  Patient denies SI or HI as well as AV hallucinations, depression, physical complaints.  He has occasional anxiety and panic attacks for which he takes Xanax.   He denies, drug use.  Rarely drinks alcohol.  Patient is a 44 y.o. male presenting with anxiety.  Anxiety Pertinent negatives include no chest pain, no abdominal pain, no headaches and no shortness of breath.    Past Medical History  Diagnosis Date  . Hypertension   . Anxiety   . Panic attack    History reviewed. No pertinent past surgical history. Family History  Problem Relation Age of Onset  . Hypertension Father    History  Substance Use Topics  . Smoking status: Current Every Day Smoker  . Smokeless tobacco: Not on file  . Alcohol Use: Yes     Comment: occ    Review of Systems  Constitutional: Negative for fever, activity change, appetite change and fatigue.  HENT: Negative for congestion, facial swelling, rhinorrhea and trouble swallowing.   Eyes: Negative for photophobia and pain.  Respiratory: Negative for cough, chest tightness and shortness of breath.   Cardiovascular: Negative for chest pain and leg swelling.  Gastrointestinal: Negative for nausea, vomiting, abdominal pain, diarrhea and constipation.  Endocrine: Negative for polydipsia and polyuria.  Genitourinary: Negative for dysuria, urgency, decreased urine volume and difficulty urinating.  Musculoskeletal: Negative for back pain and gait problem.  Skin: Negative for color change, rash and wound.  Allergic/Immunologic: Negative for immunocompromised state.  Neurological: Negative  for dizziness, facial asymmetry, speech difficulty, weakness, numbness and headaches.  Psychiatric/Behavioral: Positive for agitation. Negative for suicidal ideas, confusion, self-injury and decreased concentration. The patient is nervous/anxious.        Paranoid delusions      Allergies  Aspirin and Penicillins  Home Medications   Prior to Admission medications   Medication Sig Start Date End Date Taking? Authorizing Provider  albuterol (PROAIR HFA) 108 (90 BASE) MCG/ACT inhaler Inhale 2 puffs into the lungs every 6 (six) hours as needed. For shortness of breath    Yes Historical Provider, MD  ALPRAZolam Prudy Feeler) 1 MG tablet Take 1 mg by mouth 2 (two) times daily as needed. anxiety    Yes Historical Provider, MD  amLODipine (NORVASC) 5 MG tablet Take 5 mg by mouth daily.   Yes Historical Provider, MD  aspirin 81 MG EC tablet Take 81 mg by mouth daily.     Yes Historical Provider, MD  diclofenac (VOLTAREN) 75 MG EC tablet Take 75 mg by mouth 2 (two) times daily.   Yes Historical Provider, MD  losartan (COZAAR) 50 MG tablet Take 50 mg by mouth daily.   Yes Historical Provider, MD  Multiple Vitamin (MULTIVITAMIN) tablet Take 1 tablet by mouth daily. otc    Yes Historical Provider, MD  Omega-3 Fatty Acids (FISH OIL PO) Take 2 capsules by mouth daily.   Yes Historical Provider, MD  Omeprazole Magnesium 20.6 (20 BASE) MG CPDR Take 2 capsules by mouth daily. otc    Yes Historical Provider, MD  VITAMIN E PO Take 1 tablet by mouth daily.   Yes Historical Provider, MD  BP 146/88 mmHg  Pulse 59  Temp(Src) 97.5 F (36.4 C) (Oral)  Resp 18  SpO2 99% Physical Exam  Constitutional: He is oriented to person, place, and time. He appears well-developed and well-nourished. No distress.  HENT:  Head: Normocephalic and atraumatic.  Mouth/Throat: No oropharyngeal exudate.  Eyes: Pupils are equal, round, and reactive to light.  Neck: Normal range of motion. Neck supple.  Cardiovascular: Normal rate,  regular rhythm and normal heart sounds.  Exam reveals no gallop and no friction rub.   No murmur heard. Pulmonary/Chest: Effort normal and breath sounds normal. No respiratory distress. He has no wheezes. He has no rales.  Abdominal: Soft. Bowel sounds are normal. He exhibits no distension and no mass. There is no tenderness. There is no rebound and no guarding.  Musculoskeletal: Normal range of motion. He exhibits no edema or tenderness.  Neurological: He is alert and oriented to person, place, and time. He has normal strength. He displays no atrophy and no tremor. No cranial nerve deficit or sensory deficit. He exhibits normal muscle tone. He displays a negative Romberg sign. Coordination and gait normal. GCS eye subscore is 4. GCS verbal subscore is 5. GCS motor subscore is 6.  Skin: Skin is warm and dry.  Psychiatric: He has a normal mood and affect. Thought content is paranoid and delusional. He expresses no homicidal and no suicidal ideation. He expresses no suicidal plans.  Initially stated to me that he would kill the man who his wife is cheating on him with then later stating that no he would not do that    ED Course  Procedures (including critical care time) Labs Review Labs Reviewed  ACETAMINOPHEN LEVEL - Abnormal; Notable for the following:    Acetaminophen (Tylenol), Serum <10.0 (*)    All other components within normal limits  COMPREHENSIVE METABOLIC PANEL - Abnormal; Notable for the following:    GFR calc non Af Amer 75 (*)    GFR calc Af Amer 87 (*)    All other components within normal limits  URINE RAPID DRUG SCREEN (HOSP PERFORMED) - Abnormal; Notable for the following:    Benzodiazepines POSITIVE (*)    All other components within normal limits  CBC  ETHANOL  SALICYLATE LEVEL    Imaging Review Ct Head Wo Contrast  05/05/2014   CLINICAL DATA:  Paranoid delusions.  Dry mouth.  Initial encounter.  EXAM: CT HEAD WITHOUT CONTRAST  TECHNIQUE: Contiguous axial images were  obtained from the base of the skull through the vertex without intravenous contrast.  COMPARISON:  None.  FINDINGS: There is no evidence of acute infarction, mass lesion, or intra- or extra-axial hemorrhage on CT.  The posterior fossa, including the cerebellum, brainstem and fourth ventricle, is within normal limits. The third and lateral ventricles, and basal ganglia are unremarkable in appearance. The cerebral hemispheres are symmetric in appearance, with normal gray-white differentiation. No mass effect or midline shift is seen.  There is no evidence of fracture; visualized osseous structures are unremarkable in appearance. The orbits are within normal limits. The paranasal sinuses and mastoid air cells are well-aerated. No significant soft tissue abnormalities are seen.  IMPRESSION: Unremarkable noncontrast CT of the head.   Electronically Signed   By: Roanna RaiderJeffery  Chang M.D.   On: 05/05/2014 21:09     EKG Interpretation None      MDM   Final diagnoses:  Anxiety disorder, unspecified anxiety disorder type  Paranoid delusion    Pt is a 44 y.o. male  with Pmhx as above who presents with about 1 week of paranoid thoughts that his wife is cheating on him and is plotting to kill him.  He states that he has been under increased stress recently as his wife may be getting a laid off from work in the next several weeks.  He had a panic attack earlier today and took a Xanax, but denies SI, HI or AV hallucinations.  He also seems to have some OCD symptoms of checking.  On physical exam vitals are stable.  He is in no acute distress, he denies any physical complaints.  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  Patient speaks openly with me about the fact that he feels his wife is trying to kill him.  Patient be cleared medically more also get a CT head, given new onset psychiatric disturbance, if  negative, will get a TTS consult.  Patient seen by TTS after being cleared medically.  He does not meet inpatient criteria.  He is not expressed intent to harm himself or other people.  Psychiatry team is recommending outpatient establishment with a psychiatrist and therapist.  Patient has a PCP appointment scheduled on Monday and I have encouraged her to keep this appointment.  Patient frustrated that this issue cannot be resolved tonight.  I've explained that I do not think that this is likely a quick fix and that he will require outpatient treatment.  Wife will like him to stay at a different location tonight, which she agrees.  Patient wife are in agreement that should conditions deteriorate.  They will return to the ED  Anthonymichael Clink evaluation in the Emergency Department is complete. It has been determined that no acute conditions requiring further emergency intervention are present at this time. The patient/guardian have been advised of the diagnosis and plan. We have discussed signs and symptoms that warrant return to the ED, such as changes or worsening in symptoms, continue her to self or others.  Inability to care for self.      Toy Cookey, MD 05/06/14 [phone]

## 2014-05-05 NOTE — ED Notes (Signed)
Pt to CT at this time.

## 2014-06-22 ENCOUNTER — Other Ambulatory Visit (HOSPITAL_COMMUNITY): Payer: Self-pay | Admitting: Family Medicine

## 2014-06-22 DIAGNOSIS — M75101 Unspecified rotator cuff tear or rupture of right shoulder, not specified as traumatic: Secondary | ICD-10-CM

## 2014-06-27 ENCOUNTER — Encounter (INDEPENDENT_AMBULATORY_CARE_PROVIDER_SITE_OTHER): Payer: Self-pay | Admitting: *Deleted

## 2014-06-27 ENCOUNTER — Telehealth: Payer: Self-pay | Admitting: Gastroenterology

## 2014-06-27 NOTE — Telephone Encounter (Signed)
[facility] called to say that patient would be a new patient to us and NUR and he is wanting something sooner than what NUR has scheduled him for due to his problems. I laid the referral in your tray. Please advise.

## 2014-06-27 NOTE — Telephone Encounter (Signed)
I reviewed records sent from PCP and ER records in 05/2014. CBC normal. Several month h/o rectal bleeding.  Does not appear to be urgent issues. Offer first available and if PCP feels like patient needs to be seen urgently they can call and speak with a provider. [facility]

## 2014-06-28 ENCOUNTER — Other Ambulatory Visit: Payer: Self-pay

## 2014-06-28 ENCOUNTER — Other Ambulatory Visit (HOSPITAL_COMMUNITY): Payer: 59

## 2014-06-28 ENCOUNTER — Ambulatory Visit (HOSPITAL_COMMUNITY)
Admission: RE | Admit: 2014-06-28 | Discharge: 2014-06-28 | Disposition: A | Discharge status: Home / Self Care | Payer: 59 | Source: Ambulatory Visit | Attending: Family Medicine | Admitting: Family Medicine

## 2014-06-28 ENCOUNTER — Ambulatory Visit (INDEPENDENT_AMBULATORY_CARE_PROVIDER_SITE_OTHER): Payer: 59 | Admitting: Gastroenterology

## 2014-06-28 ENCOUNTER — Encounter: Payer: Self-pay | Admitting: Gastroenterology

## 2014-06-28 VITALS — BP 152/95 | HR 77 | Temp 97.7°F | Ht 73.0 in | Wt 229.0 lb

## 2014-06-28 DIAGNOSIS — K625 Hemorrhage of anus and rectum: Secondary | ICD-10-CM | POA: Insufficient documentation

## 2014-06-28 DIAGNOSIS — M25811 Other specified joint disorders, right shoulder: Secondary | ICD-10-CM | POA: Diagnosis not present

## 2014-06-28 DIAGNOSIS — K219 Gastro-esophageal reflux disease without esophagitis: Secondary | ICD-10-CM

## 2014-06-28 DIAGNOSIS — M12811 Other specific arthropathies, not elsewhere classified, right shoulder: Secondary | ICD-10-CM | POA: Diagnosis not present

## 2014-06-28 DIAGNOSIS — R1013 Epigastric pain: Secondary | ICD-10-CM | POA: Diagnosis not present

## 2014-06-28 DIAGNOSIS — M7501 Adhesive capsulitis of right shoulder: Secondary | ICD-10-CM | POA: Insufficient documentation

## 2014-06-28 DIAGNOSIS — M75101 Unspecified rotator cuff tear or rupture of right shoulder, not specified as traumatic: Secondary | ICD-10-CM

## 2014-06-28 MED ORDER — PEG-KCL-NACL-NASULF-NA ASC-C 100 G PO SOLR: 1.0000 | ORAL | Status: DC

## 2014-06-28 NOTE — Telephone Encounter (Signed)
Pt is aware of OV with [facility] on 4/28 at 330

## 2014-06-28 NOTE — Progress Notes (Signed)
Primary Care Physician:  Purvis Kilts, MD  Primary Gastroenterologist:  Garfield Cornea, MD   Chief Complaint  Patient presents with  . Blood In Stools    HPI:  [name] is a 44 y.o. male here at the request of Dr. Hilma Favors for further evaluation of rectal bleeding. S  Patient reports several week history of noting streaks of red blood in his stools. Mild in severity. Associated with normal bowel movements. Denies melena. No rectal pain. Nothing seems to make it better or worse. Never had a colonoscopy.  Also complains of epigastric burning several days per week. Takes over-the-counter Prilosec 20 mg twice daily. Typical heartburn well-controlled. Takes Voltaren as needed for shoulder pain. No previous EGD. Denies vomiting or dysphagia. No melena. Seen about 15 years ago by Dr. Laural Golden for GERD and started on Nexium at that time.   Recently started on BuSpar and Celexa for anxiety. Previously had been on Xanax but this was discontinued.   Current Outpatient Prescriptions  Medication Sig Dispense Refill  . albuterol (PROAIR HFA) 108 (90 BASE) MCG/ACT inhaler Inhale 2 puffs into the lungs every 6 (six) hours as needed. For shortness of breath     . amLODipine (NORVASC) 5 MG tablet Take 5 mg by mouth daily.    Marland Kitchen aspirin 81 MG EC tablet Take 81 mg by mouth daily.      . busPIRone (BUSPAR) 10 MG tablet Take 10 mg by mouth 3 (three) times daily.    Marland Kitchen CINNAMON PO Take 500 mg by mouth 2 (two) times daily.    . citalopram (CELEXA) 10 MG tablet Take 10 mg by mouth daily.    . diclofenac (VOLTAREN) 75 MG EC tablet Take 75 mg by mouth as needed.     Marland Kitchen losartan (COZAAR) 50 MG tablet Take 50 mg by mouth daily.    . Omega-3 Fatty Acids (FISH OIL PO) Take 2 capsules by mouth daily.    . Omeprazole Magnesium 20.6 (20 BASE) MG CPDR Take 2 capsules by mouth daily. otc     . VITAMIN E PO Take 1 tablet by mouth daily.    . Multiple Vitamin (MULTIVITAMIN) tablet Take 1 tablet by mouth daily. otc      . peg 3350 powder (MOVIPREP) 100 G SOLR Take 1 kit (200 g total) by mouth as directed. 1 kit 0   No current facility-administered medications for this visit.    Allergies as of 06/28/2014 - Review Complete 06/28/2014  Allergen Reaction Noted  . Penicillins  09/17/2010    Past Medical History  Diagnosis Date  . Hypertension   . Anxiety   . Panic attack   . GERD (gastroesophageal reflux disease)     Past Surgical History  Procedure Laterality Date  . Wisdom tooth extraction      Family History  Problem Relation Age of Onset  . Hypertension Father   . Arthritis/Rheumatoid Mother   . Heart attack Paternal Grandfather   . Colon cancer Neg Hx   . Pancreatic cancer Maternal Uncle     History   Social History  . Marital Status: Married    Spouse Name: N/A  . Number of Children: 2  . Years of Education: N/A   Occupational History  . city of [place], traffic     Social History Main Topics  . Smoking status: Current Every Day Smoker -- 0.50 packs/day    Types: Cigarettes  . Smokeless tobacco: Not on file  . Alcohol Use: 0.0  oz/week    0 Standard drinks or equivalent per week     Comment: three days per week, 24-36 ounces at a time. heavier in past.   . Drug Use: No  . Sexual Activity: Not on file   Other Topics Concern  . Not on file   Social History Narrative      ROS:  General: Negative for anorexia, weight loss, fever, chills, fatigue, weakness. Eyes: Negative for vision changes.  ENT: Negative for hoarseness, difficulty swallowing , nasal congestion. CV: Negative for chest pain, angina, palpitations, dyspnea on exertion, peripheral edema.  Respiratory: Negative for dyspnea at rest, dyspnea on exertion, cough, sputum, wheezing.  GI: See history of present illness. GU:  Negative for dysuria, hematuria, urinary incontinence, urinary frequency, nocturnal urination.  MS: Negative for joint pain, low back pain.  Derm: Negative for rash or itching.   Neuro: Negative for weakness, abnormal sensation, seizure, frequent headaches, memory loss, confusion.  Psych: +for anxiety but states it is well-controlled. No depression, suicidal ideation, hallucinations.  Endo: Negative for unusual weight change.  Heme: Negative for bruising or bleeding. Allergy: Negative for rash or hives.    Physical Examination:  BP 152/95 mmHg  Pulse 77  Temp(Src) 97.7 F (36.5 C)  Ht _0  (1.854 m)  Wt 229 lb (103.874 kg)  BMI 30.22 kg/m2   General: Well-nourished, well-developed in no acute distress.  Head: Normocephalic, atraumatic.   Eyes: Conjunctiva pink, no icterus. Mouth: Oropharyngeal mucosa moist and pink , no lesions erythema or exudate. Neck: Supple without thyromegaly, masses, or lymphadenopathy.  Lungs: Clear to auscultation bilaterally.  Heart: Regular rate and rhythm, no murmurs rubs or gallops.  Abdomen: Bowel sounds are normal, nontender, nondistended, no hepatosplenomegaly or masses, no abdominal bruits or    hernia , no rebound or guarding.   Rectal: not performed Extremities: No lower extremity edema. No clubbing or deformities.  Neuro: Alert and oriented x 4 , grossly normal neurologically.  Skin: Warm and dry, no rash or jaundice.   Psych: Alert and cooperative, normal mood and affect. No anxiety.  Labs: Lab Results  Component Value Date   WBC 7.6 05/05/2014   HGB 14.2 05/05/2014   HCT 41.6 05/05/2014   MCV 87.2 05/05/2014   PLT 196 05/05/2014   Lab Results  Component Value Date   CREATININE 1.18 05/05/2014   BUN 22 05/05/2014   NA 142 05/05/2014   K 3.9 05/05/2014   CL 107 05/05/2014   CO2 27 05/05/2014   Lab Results  Component Value Date   ALT 36 05/05/2014   AST 36 05/05/2014   ALKPHOS 42 05/05/2014   BILITOT 0.7 05/05/2014     Imaging Studies: Mr Shoulder Right Wo Contrast  06/28/2014   CLINICAL DATA:  Pain and popping of the right shoulder of the past 5 years.  EXAM: MRI OF THE RIGHT SHOULDER WITHOUT  CONTRAST  TECHNIQUE: Multiplanar, multisequence MR imaging of the shoulder was performed. No intravenous contrast was administered.  COMPARISON:  12/11/2003  FINDINGS: Rotator cuff:  Unremarkable  Muscles:  Subtle teres minor edema.  Biceps long head: Minimal tendinopathy of the intra-articular segment.  Acromioclavicular Joint: Moderate degenerative AC joint arthropathy with associated spurring and marrow edema. The acromial undersurface is type 1 (flat).  Glenohumeral Joint: Minimal degenerative spurring of the glenoid and humeral head with mild thinning of the articular cartilage.  Labrum: 1.6 by 0.7 by 0.5 cm fluid signal intensity collection extending inferiorly from the inferior labrum on image 21  series 7. As best I can tell this is different from the surrounding venous structures and accordingly is probably not a venous varix. A small paralabral cyst is suspected and accordingly raises the possibility of a small inferior labral tear. Ganglion cyst is a differential diagnostic consideration.  Bones:  Unremarkable except as noted above.  IMPRESSION: 1. Paralabral cyst or small ganglion cyst extending inferiorly from the inferior labrum. This raises the possibility of a small inferior labral tear, but a tear is not directly visualized. 2. Subtle teres minor edema could reflect low grade quadrilateral space syndrome. 3. Minimal biceps tendinopathy. 4. Moderate degenerative AC joint arthropathy.   Electronically Signed   By: Van Clines M.D.   On: 06/28/2014 10:16

## 2014-06-28 NOTE — Patient Instructions (Signed)
1. Colonoscopy and upper endoscopy in the near future. See separate instructions.

## 2014-06-29 NOTE — Assessment & Plan Note (Signed)
Several week history of blood mixed in stool. No prior colonoscopy. Suspect benign anorectal source but need to exclude IBD, malignancy. Augment conscious sedation with Phenergan 25 mg IV 30 minutes before the procedure. He will receive split dosed Moviprep.  I have discussed the risks, alternatives, benefits with regards to but not limited to the risk of reaction to medication, bleeding, infection, perforation and the patient is agreeable to proceed. Written consent to be obtained.

## 2014-06-29 NOTE — Assessment & Plan Note (Addendum)
44 year old gentleman with chronic heartburn of at least 15 years duration, on PPI therapy. Typical symptoms well controlled. Complains of intermittent epigastric burning. Voltaren used for shoulder pain intermittently. Patient has never had an endoscopy. Recommend EGD to rule out complicated GERD, i.e. Barrett's esophagus, as well as to evaluate epigastric burning which is possibly related gastritis.  We discussed sedation at length because of his history of Xanax use in the past. He has some alcohol consumption but not daily, previously heavier. Recently started on antidepressants. He has never had a try of conscious sedation. He prefers conscious sedation over deep sedation in the OR. Discussed possibility of inadequate sedation but we will augment with Phenergan 25 mg IV 30 minutes before the procedure.   I have discussed the risks, alternatives, benefits with regards to but not limited to the risk of reaction to medication, bleeding, infection, perforation and the patient is agreeable to proceed. Written consent to be obtained.  Continue over-the-counter Prilosec 20 mg twice a day for now. May need to adjust based on findings.

## 2014-07-01 DIAGNOSIS — J342 Deviated nasal septum: Secondary | ICD-10-CM

## 2014-07-01 DIAGNOSIS — J343 Hypertrophy of nasal turbinates: Secondary | ICD-10-CM

## 2014-07-01 DIAGNOSIS — M950 Acquired deformity of nose: Secondary | ICD-10-CM

## 2014-07-01 HISTORY — DX: Hypertrophy of nasal turbinates: J34.3

## 2014-07-01 HISTORY — DX: Acquired deformity of nose: M95.0

## 2014-07-01 HISTORY — DX: Deviated nasal septum: J34.2

## 2014-07-02 NOTE — Progress Notes (Signed)
cc'ed to pcp °

## 2014-07-12 NOTE — Progress Notes (Signed)
PA# [id number]

## 2014-07-13 ENCOUNTER — Ambulatory Visit (HOSPITAL_COMMUNITY)
Admission: RE | Admit: 2014-07-13 | Discharge: 2014-07-13 | Disposition: A | Discharge status: Home / Self Care | Payer: 59 | Source: Ambulatory Visit | Attending: Internal Medicine | Admitting: Internal Medicine

## 2014-07-13 ENCOUNTER — Encounter (HOSPITAL_COMMUNITY)
Admission: RE | Disposition: A | Discharge status: Home / Self Care | Payer: Self-pay | Source: Ambulatory Visit | Attending: Internal Medicine

## 2014-07-13 ENCOUNTER — Encounter (HOSPITAL_COMMUNITY): Payer: Self-pay

## 2014-07-13 DIAGNOSIS — I10 Essential (primary) hypertension: Secondary | ICD-10-CM | POA: Insufficient documentation

## 2014-07-13 DIAGNOSIS — Z880 Allergy status to penicillin: Secondary | ICD-10-CM | POA: Insufficient documentation

## 2014-07-13 DIAGNOSIS — K219 Gastro-esophageal reflux disease without esophagitis: Secondary | ICD-10-CM | POA: Diagnosis not present

## 2014-07-13 DIAGNOSIS — L929 Granulomatous disorder of the skin and subcutaneous tissue, unspecified: Secondary | ICD-10-CM | POA: Diagnosis not present

## 2014-07-13 DIAGNOSIS — F17210 Nicotine dependence, cigarettes, uncomplicated: Secondary | ICD-10-CM | POA: Insufficient documentation

## 2014-07-13 DIAGNOSIS — K921 Melena: Secondary | ICD-10-CM | POA: Diagnosis not present

## 2014-07-13 DIAGNOSIS — Z7982 Long term (current) use of aspirin: Secondary | ICD-10-CM | POA: Diagnosis not present

## 2014-07-13 DIAGNOSIS — Z79899 Other long term (current) drug therapy: Secondary | ICD-10-CM | POA: Diagnosis not present

## 2014-07-13 DIAGNOSIS — K625 Hemorrhage of anus and rectum: Secondary | ICD-10-CM

## 2014-07-13 DIAGNOSIS — K5730 Diverticulosis of large intestine without perforation or abscess without bleeding: Secondary | ICD-10-CM | POA: Diagnosis not present

## 2014-07-13 DIAGNOSIS — F419 Anxiety disorder, unspecified: Secondary | ICD-10-CM | POA: Diagnosis not present

## 2014-07-13 DIAGNOSIS — K626 Ulcer of anus and rectum: Secondary | ICD-10-CM | POA: Diagnosis not present

## 2014-07-13 DIAGNOSIS — K629 Disease of anus and rectum, unspecified: Secondary | ICD-10-CM | POA: Insufficient documentation

## 2014-07-13 DIAGNOSIS — K6289 Other specified diseases of anus and rectum: Secondary | ICD-10-CM | POA: Diagnosis not present

## 2014-07-13 HISTORY — PX: COLONOSCOPY: SHX5424

## 2014-07-13 HISTORY — PX: ESOPHAGOGASTRODUODENOSCOPY: SHX5428

## 2014-07-13 SURGERY — COLONOSCOPY
Anesthesia: Moderate Sedation

## 2014-07-13 MED ORDER — MIDAZOLAM HCL 5 MG/5ML IJ SOLN
INTRAMUSCULAR | Status: DC | PRN
  Administered 2014-07-13 (×3): 2 mg via INTRAVENOUS

## 2014-07-13 MED ORDER — STERILE WATER FOR IRRIGATION IR SOLN
Status: DC | PRN
  Administered 2014-07-13: 13:00:00

## 2014-07-13 MED ORDER — SODIUM CHLORIDE 0.9 % IJ SOLN
INTRAMUSCULAR | Status: AC
  Filled 2014-07-13: qty 3

## 2014-07-13 MED ORDER — MEPERIDINE HCL 100 MG/ML IJ SOLN
INTRAMUSCULAR | Status: DC | PRN
  Administered 2014-07-13 (×2): 50 mg via INTRAVENOUS
  Administered 2014-07-13: 25 mg via INTRAVENOUS

## 2014-07-13 MED ORDER — MIDAZOLAM HCL 5 MG/5ML IJ SOLN
INTRAMUSCULAR | Status: AC
  Filled 2014-07-13: qty 10

## 2014-07-13 MED ORDER — LIDOCAINE VISCOUS 2 % MT SOLN
OROMUCOSAL | Status: DC | PRN
  Administered 2014-07-13: 5 mL via OROMUCOSAL

## 2014-07-13 MED ORDER — SODIUM CHLORIDE 0.9 % IV SOLN
INTRAVENOUS | Status: DC
  Administered 2014-07-13: 12:00:00 via INTRAVENOUS

## 2014-07-13 MED ORDER — ONDANSETRON HCL 4 MG/2ML IJ SOLN
INTRAMUSCULAR | Status: AC
  Filled 2014-07-13: qty 2

## 2014-07-13 MED ORDER — ONDANSETRON HCL 4 MG/2ML IJ SOLN
INTRAMUSCULAR | Status: DC | PRN
  Administered 2014-07-13: 4 mg via INTRAVENOUS

## 2014-07-13 MED ORDER — PROMETHAZINE HCL 25 MG/ML IJ SOLN
25.0000 mg | Freq: Once | INTRAMUSCULAR | Status: AC
Start: 2014-07-13 — End: 2014-07-13
  Administered 2014-07-13: 25 mg via INTRAVENOUS

## 2014-07-13 MED ORDER — MEPERIDINE HCL 100 MG/ML IJ SOLN
INTRAMUSCULAR | Status: AC
  Filled 2014-07-13: qty 2

## 2014-07-13 MED ORDER — LIDOCAINE VISCOUS 2 % MT SOLN
OROMUCOSAL | Status: AC
  Filled 2014-07-13: qty 15

## 2014-07-13 MED ORDER — PROMETHAZINE HCL 25 MG/ML IJ SOLN
INTRAMUSCULAR | Status: AC
  Filled 2014-07-13: qty 1

## 2014-07-13 NOTE — Op Note (Signed)
[facility] [address] [place], [id number]   ENDOSCOPY PROCEDURE REPORT  PATIENT: Raymond ReapChambers, Casmer D  MR#: [id number] BIRTHDATE: 1971/01/24 , 43  yrs. old GENDER: male ENDOSCOPIST: R.  Roetta SessionsMichael Madex Seals, MD FACP FACG REFERRED BY:  Assunta FoundJohn Golding, M.D. PROCEDURE DATE:  07/13/2014 PROCEDURE:  EGD, diagnostic INDICATIONS:  Long-standing GERD. MEDICATIONS: Versed 6 mg IV and Demerol 125 mg IV in divided doses. Zofran 4 mg IV.  Phenergan 25 mg IV. ASA CLASS:      Class II  CONSENT: The risks, benefits, limitations, alternatives and imponderables have been discussed.  The potential for biopsy, esophogeal dilation, etc. have also been reviewed.  Questions have been answered.  All parties agreeable.  Please see the history and physical in the medical record for more information.  DESCRIPTION OF PROCEDURE: After the risks benefits and alternatives of the procedure were thoroughly explained, informed consent was obtained.  The [id number] ([id number] endoscope was introduced through the mouth and advanced to the second portion of the duodenum , limited by Without limitations. The instrument was slowly withdrawn as the mucosa was fully examined.    Normal-appearing tubular esophagus.  Stomach empty; couple of tiny antral erosions; otherwise, the gastric mucosa appeared entirely normal.  Patent pylorus.  Normal-appearing first and second portion of the duodenum.  Retroflexed views revealed no abnormalities. The scope was then withdrawn from the patient and the procedure completed.  COMPLICATIONS: There were no immediate complications.  ENDOSCOPIC IMPRESSION: Essentially normal EGD as described above  RECOMMENDATIONS: Trial of Dexilant 60 mg daily. Stop omeprazole while on Dexilant. See colonoscopy report.  REPEAT EXAM:  eSigned:  R. Roetta SessionsMichael Liany Mumpower, MD Jerrel IvoryFACP [facility] HospitalFACG 07/13/2014 1:31 PM    CC:  CPT CODES: ICD CODES:  The ICD and CPT codes recommended by this software  are interpretations from the data that the clinical staff has captured with the software.  The verification of the translation of this report to the ICD and CPT codes and modifiers is the sole responsibility of the health care institution and practicing physician where this report was generated.  [employer] will not be held responsible for the validity of the ICD and CPT codes included on this report.  AMA assumes no liability for data contained or not contained herein. CPT is a Publishing rights managerregistered trademark of the [employer].  PATIENT NAME:  Raymond ReapChambers, [name] MR#: [id number]

## 2014-07-13 NOTE — H&P (View-Only) (Signed)
Primary Care Physician:  Purvis Kilts, MD  Primary Gastroenterologist:  Garfield Cornea, MD   Chief Complaint  Patient presents with  . Blood In Stools    HPI:  [name] is a 44 y.o. male here at the request of Dr. Hilma Favors for further evaluation of rectal bleeding. S  Patient reports several week history of noting streaks of red blood in his stools. Mild in severity. Associated with normal bowel movements. Denies melena. No rectal pain. Nothing seems to make it better or worse. Never had a colonoscopy.  Also complains of epigastric burning several days per week. Takes over-the-counter Prilosec 20 mg twice daily. Typical heartburn well-controlled. Takes Voltaren as needed for shoulder pain. No previous EGD. Denies vomiting or dysphagia. No melena. Seen about 15 years ago by Dr. Laural Golden for GERD and started on Nexium at that time.   Recently started on BuSpar and Celexa for anxiety. Previously had been on Xanax but this was discontinued.   Current Outpatient Prescriptions  Medication Sig Dispense Refill  . albuterol (PROAIR HFA) 108 (90 BASE) MCG/ACT inhaler Inhale 2 puffs into the lungs every 6 (six) hours as needed. For shortness of breath     . amLODipine (NORVASC) 5 MG tablet Take 5 mg by mouth daily.    Marland Kitchen aspirin 81 MG EC tablet Take 81 mg by mouth daily.      . busPIRone (BUSPAR) 10 MG tablet Take 10 mg by mouth 3 (three) times daily.    Marland Kitchen CINNAMON PO Take 500 mg by mouth 2 (two) times daily.    . citalopram (CELEXA) 10 MG tablet Take 10 mg by mouth daily.    . diclofenac (VOLTAREN) 75 MG EC tablet Take 75 mg by mouth as needed.     Marland Kitchen losartan (COZAAR) 50 MG tablet Take 50 mg by mouth daily.    . Omega-3 Fatty Acids (FISH OIL PO) Take 2 capsules by mouth daily.    . Omeprazole Magnesium 20.6 (20 BASE) MG CPDR Take 2 capsules by mouth daily. otc     . VITAMIN E PO Take 1 tablet by mouth daily.    . Multiple Vitamin (MULTIVITAMIN) tablet Take 1 tablet by mouth daily. otc      . peg 3350 powder (MOVIPREP) 100 G SOLR Take 1 kit (200 g total) by mouth as directed. 1 kit 0   No current facility-administered medications for this visit.    Allergies as of 06/28/2014 - Review Complete 06/28/2014  Allergen Reaction Noted  . Penicillins  09/17/2010    Past Medical History  Diagnosis Date  . Hypertension   . Anxiety   . Panic attack   . GERD (gastroesophageal reflux disease)     Past Surgical History  Procedure Laterality Date  . Wisdom tooth extraction      Family History  Problem Relation Age of Onset  . Hypertension Father   . Arthritis/Rheumatoid Mother   . Heart attack Paternal Grandfather   . Colon cancer Neg Hx   . Pancreatic cancer Maternal Uncle     History   Social History  . Marital Status: Married    Spouse Name: N/A  . Number of Children: 2  . Years of Education: N/A   Occupational History  . city of [place], traffic     Social History Main Topics  . Smoking status: Current Every Day Smoker -- 0.50 packs/day    Types: Cigarettes  . Smokeless tobacco: Not on file  . Alcohol Use: 0.0  oz/week    0 Standard drinks or equivalent per week     Comment: three days per week, 24-36 ounces at a time. heavier in past.   . Drug Use: No  . Sexual Activity: Not on file   Other Topics Concern  . Not on file   Social History Narrative      ROS:  General: Negative for anorexia, weight loss, fever, chills, fatigue, weakness. Eyes: Negative for vision changes.  ENT: Negative for hoarseness, difficulty swallowing , nasal congestion. CV: Negative for chest pain, angina, palpitations, dyspnea on exertion, peripheral edema.  Respiratory: Negative for dyspnea at rest, dyspnea on exertion, cough, sputum, wheezing.  GI: See history of present illness. GU:  Negative for dysuria, hematuria, urinary incontinence, urinary frequency, nocturnal urination.  MS: Negative for joint pain, low back pain.  Derm: Negative for rash or itching.   Neuro: Negative for weakness, abnormal sensation, seizure, frequent headaches, memory loss, confusion.  Psych: +for anxiety but states it is well-controlled. No depression, suicidal ideation, hallucinations.  Endo: Negative for unusual weight change.  Heme: Negative for bruising or bleeding. Allergy: Negative for rash or hives.    Physical Examination:  BP 152/95 mmHg  Pulse 77  Temp(Src) 97.7 F (36.5 C)  Ht _0  (1.854 m)  Wt 229 lb (103.874 kg)  BMI 30.22 kg/m2   General: Well-nourished, well-developed in no acute distress.  Head: Normocephalic, atraumatic.   Eyes: Conjunctiva pink, no icterus. Mouth: Oropharyngeal mucosa moist and pink , no lesions erythema or exudate. Neck: Supple without thyromegaly, masses, or lymphadenopathy.  Lungs: Clear to auscultation bilaterally.  Heart: Regular rate and rhythm, no murmurs rubs or gallops.  Abdomen: Bowel sounds are normal, nontender, nondistended, no hepatosplenomegaly or masses, no abdominal bruits or    hernia , no rebound or guarding.   Rectal: not performed Extremities: No lower extremity edema. No clubbing or deformities.  Neuro: Alert and oriented x 4 , grossly normal neurologically.  Skin: Warm and dry, no rash or jaundice.   Psych: Alert and cooperative, normal mood and affect. No anxiety.  Labs: Lab Results  Component Value Date   WBC 7.6 05/05/2014   HGB 14.2 05/05/2014   HCT 41.6 05/05/2014   MCV 87.2 05/05/2014   PLT 196 05/05/2014   Lab Results  Component Value Date   CREATININE 1.18 05/05/2014   BUN 22 05/05/2014   NA 142 05/05/2014   K 3.9 05/05/2014   CL 107 05/05/2014   CO2 27 05/05/2014   Lab Results  Component Value Date   ALT 36 05/05/2014   AST 36 05/05/2014   ALKPHOS 42 05/05/2014   BILITOT 0.7 05/05/2014     Imaging Studies: Mr Shoulder Right Wo Contrast  06/28/2014   CLINICAL DATA:  Pain and popping of the right shoulder of the past 5 years.  EXAM: MRI OF THE RIGHT SHOULDER WITHOUT  CONTRAST  TECHNIQUE: Multiplanar, multisequence MR imaging of the shoulder was performed. No intravenous contrast was administered.  COMPARISON:  12/11/2003  FINDINGS: Rotator cuff:  Unremarkable  Muscles:  Subtle teres minor edema.  Biceps long head: Minimal tendinopathy of the intra-articular segment.  Acromioclavicular Joint: Moderate degenerative AC joint arthropathy with associated spurring and marrow edema. The acromial undersurface is type 1 (flat).  Glenohumeral Joint: Minimal degenerative spurring of the glenoid and humeral head with mild thinning of the articular cartilage.  Labrum: 1.6 by 0.7 by 0.5 cm fluid signal intensity collection extending inferiorly from the inferior labrum on image 21  series 7. As best I can tell this is different from the surrounding venous structures and accordingly is probably not a venous varix. A small paralabral cyst is suspected and accordingly raises the possibility of a small inferior labral tear. Ganglion cyst is a differential diagnostic consideration.  Bones:  Unremarkable except as noted above.  IMPRESSION: 1. Paralabral cyst or small ganglion cyst extending inferiorly from the inferior labrum. This raises the possibility of a small inferior labral tear, but a tear is not directly visualized. 2. Subtle teres minor edema could reflect low grade quadrilateral space syndrome. 3. Minimal biceps tendinopathy. 4. Moderate degenerative AC joint arthropathy.   Electronically Signed   By: Van Clines M.D.   On: 06/28/2014 10:16

## 2014-07-13 NOTE — Op Note (Signed)
[facility] [address] [place], [id number]   COLONOSCOPY PROCEDURE REPORT  PATIENT: [name], [name]  MR#: [id number] BIRTHDATE: 03/13/1970 , 43  yrs. old GENDER: male ENDOSCOPIST: R.  Roetta SessionsMichael Massai Hankerson, MD FACP [facility] REFERRED OZ:HYQMBY:John Phillips OdorGolding, M.Koch. PROCEDURE DATE:  07/13/2014 PROCEDURE:   Colonoscopy with biopsy INDICATIONS:Intermittent hematochezia. MEDICATIONS: Versed 7 mg IV and Demerol 125 mg IV in divided doses. Zofran 4 mg IV.  Phenergan25 mg IV. ASA CLASS:       Class II  CONSENT: The risks, benefits, alternatives and imponderables including but not limited to bleeding, perforation as well as the possibility of a missed lesion have been reviewed.  The potential for biopsy, lesion removal, etc. have also been discussed. Questions have been answered.  All parties agreeable.  Please see the history and physical in the medical record for more information.  DESCRIPTION OF PROCEDURE:   After the risks benefits and alternatives of the procedure were thoroughly explained, informed consent was obtained.  The digital rectal exam revealed no abnormalities of the rectum.   The [id number] ([id number]  endoscope was introduced through the anus and advanced to the cecum, which was identified by both the appendix and ileocecal valve. No adverse events experienced.   The quality of the prep was adequate  The instrument was then slowly withdrawn as the colon was fully examined.      COLON FINDINGS: Patient was noted to have a single 7 mm broad-based pedunculated lesion just inside the anal verge with a central "volcano" area of ulceration.  Please see photos.  The remainder of the rectal mucosa appeared normal.  The patient had scattered left-sided diverticula; the remainder of the colonic mucosa appeared normal.  The rectal lesion was biopsied.  The abnormal distal rectal lesion was biopsied for histologic study. Retroflexion was performed. .  Withdrawal time=10  minutes 0 seconds.  The scope was withdrawn and the procedure completed. COMPLICATIONS: There were no immediate complications.  ENDOSCOPIC IMPRESSION: Rectal lesion as described above?"status post biopsy. Colonic diverticulosis. I suspect bleeding is emanating from a rectal lesion.  RECOMMENDATIONS: Follow-up on pathology. See EGD report.  eSigned:  R. Roetta SessionsMichael Gwenna Fuston, MD Jerrel IvoryFACP [facility] 07/13/2014 2:03 PM   cc:  CPT CODES: ICD CODES:  The ICD and CPT codes recommended by this software are interpretations from the data that the clinical staff has captured with the software.  The verification of the translation of this report to the ICD and CPT codes and modifiers is the sole responsibility of the health care institution and practicing physician where this report was generated.  [employer] will not be held responsible for the validity of the ICD and CPT codes included on this report.  AMA assumes no liability for data contained or not contained herein. CPT is a Publishing rights managerregistered trademark of the [employer].  PATIENT NAME:  [name], [name] MR#: [id number]

## 2014-07-13 NOTE — Discharge Instructions (Signed)
Colonoscopy Discharge Instructions  Read the instructions outlined below and refer to this sheet in the next few weeks. These discharge instructions provide you with general information on caring for yourself after you leave the hospital. Your doctor may also give you specific instructions. While your treatment has been planned according to the most current medical practices available, unavoidable complications occasionally occur. If you have any problems or questions after discharge, call Dr. Gala Romney at [phone]. ACTIVITY  You may resume your regular activity, but move at a slower pace for the next 24 hours.   Take frequent rest periods for the next 24 hours.   Walking will help get rid of the air and reduce the bloated feeling in your belly (abdomen).   No driving for 24 hours (because of the medicine (anesthesia) used during the test).    Do not sign any important legal documents or operate any machinery for 24 hours (because of the anesthesia used during the test).  NUTRITION  Drink plenty of fluids.   You may resume your normal diet as instructed by your doctor.   Begin with a light meal and progress to your normal diet. Heavy or fried foods are harder to digest and may make you feel sick to your stomach (nauseated).   Avoid alcoholic beverages for 24 hours or as instructed.  MEDICATIONS  You may resume your normal medications unless your doctor tells you otherwise.  WHAT YOU CAN EXPECT TODAY  Some feelings of bloating in the abdomen.   Passage of more gas than usual.   Spotting of blood in your stool or on the toilet paper.  IF YOU HAD POLYPS REMOVED DURING THE COLONOSCOPY:  No aspirin products for 7 days or as instructed.   No alcohol for 7 days or as instructed.   Eat a soft diet for the next 24 hours.  FINDING OUT THE RESULTS OF YOUR TEST Not all test results are available during your visit. If your test results are not back during the visit, make an appointment  with your caregiver to find out the results. Do not assume everything is normal if you have not heard from your caregiver or the medical facility. It is important for you to follow up on all of your test results.  SEEK IMMEDIATE MEDICAL ATTENTION IF:  You have more than a spotting of blood in your stool.   Your belly is swollen (abdominal distention).   You are nauseated or vomiting.   You have a temperature over 101.  You have abdominal pain or discomfort that is severe or gets worse throughout the day. EGD Discharge instructions Please read the instructions outlined below and refer to this sheet in the next few weeks. These discharge instructions provide you with general information on caring for yourself after you leave the hospital. Your doctor may also give you specific instructions. While your treatment has been planned according to the most current medical practices available, unavoidable complications occasionally occur. If you have any problems or questions after discharge, please call your doctor. ACTIVITY You may resume your regular activity but move at a slower pace for the next 24 hours.  Take frequent rest periods for the next 24 hours.  Walking will help expel (get rid of) the air and reduce the bloated feeling in your abdomen.  No driving for 24 hours (because of the anesthesia (medicine) used during the test).  You may shower.  Do not sign any important legal documents or operate any machinery for 24  hours (because of the anesthesia used during the test).  NUTRITION Drink plenty of fluids.  You may resume your normal diet.  Begin with a light meal and progress to your normal diet.  Avoid alcoholic beverages for 24 hours or as instructed by your caregiver.  MEDICATIONS You may resume your normal medications unless your caregiver tells you otherwise.  WHAT YOU CAN EXPECT TODAY You may experience abdominal discomfort such as a feeling of fullness or gas pains.   FOLLOW-UP Your doctor will discuss the results of your test with you.  SEEK IMMEDIATE MEDICAL ATTENTION IF ANY OF THE FOLLOWING OCCUR: Excessive nausea (feeling sick to your stomach) and/or vomiting.  Severe abdominal pain and distention (swelling).  Trouble swallowing.  Temperature over 101 F (37.8 C).  Rectal bleeding or vomiting of blood.     GERD, diverticulosis information provided  Stop omeprazole; try one-month course of Dexilant 60 mg daily  Further recommendations to follow pending review of pathology report  Diverticulosis Diverticulosis is the condition that develops when small pouches (diverticula) form in the wall of your colon. Your colon, or large intestine, is where water is absorbed and stool is formed. The pouches form when the inside layer of your colon pushes through weak spots in the outer layers of your colon. CAUSES  No one knows exactly what causes diverticulosis. RISK FACTORS Being older than 50. Your risk for this condition increases with age. Diverticulosis is rare in people younger than 40 years. By age 42, almost everyone has it. Eating a low-fiber diet. Being frequently constipated. Being overweight. Not getting enough exercise. Smoking. Taking over-the-counter pain medicines, like aspirin and ibuprofen. SYMPTOMS  Most people with diverticulosis do not have symptoms. DIAGNOSIS  Because diverticulosis often has no symptoms, health care providers often discover the condition during an exam for other colon problems. In many cases, a health care provider will diagnose diverticulosis while using a flexible scope to examine the colon (colonoscopy). TREATMENT  If you have never developed an infection related to diverticulosis, you may not need treatment. If you have had an infection before, treatment may include: Eating more fruits, vegetables, and grains. Taking a fiber supplement. Taking a live bacteria supplement (probiotic). Taking medicine to  relax your colon. HOME CARE INSTRUCTIONS  Drink at least 6-8 glasses of water each day to prevent constipation. Try not to strain when you have a bowel movement. Keep all follow-up appointments. If you have had an infection before: Increase the fiber in your diet as directed by your health care provider or dietitian. Take a dietary fiber supplement if your health care provider approves. Only take medicines as directed by your health care provider. SEEK MEDICAL CARE IF:  You have abdominal pain. You have bloating. You have cramps. You have not gone to the bathroom in 3 days. SEEK IMMEDIATE MEDICAL CARE IF:  Your pain gets worse. Yourbloating becomes very bad. You have a fever or chills, and your symptoms suddenly get worse. You begin vomiting. You have bowel movements that are bloody or black. MAKE SURE YOU: Understand these instructions. Will watch your condition. Will get help right away if you are not doing well or get worse. Document Released: 11/14/2003 Document Revised: 02/21/2013 Document Reviewed: 01/11/2013 [facility] Patient Information 2015 [place], [place]. This information is not intended to replace advice given to you by your health care provider. Make sure you discuss any questions you have with your health care provider. Gastroesophageal Reflux Disease, Adult Gastroesophageal reflux disease (GERD) happens when acid  from your stomach flows up into the esophagus. When acid comes in contact with the esophagus, the acid causes soreness (inflammation) in the esophagus. Over time, GERD may create small holes (ulcers) in the lining of the esophagus. CAUSES  Increased body weight. This puts pressure on the stomach, making acid rise from the stomach into the esophagus. Smoking. This increases acid production in the stomach. Drinking alcohol. This causes decreased pressure in the lower esophageal sphincter (valve or ring of muscle between the esophagus and stomach), allowing acid  from the stomach into the esophagus. Late evening meals and a full stomach. This increases pressure and acid production in the stomach. A malformed lower esophageal sphincter. Sometimes, no cause is found. SYMPTOMS  Burning pain in the lower part of the mid-chest behind the breastbone and in the mid-stomach area. This may occur twice a week or more often. Trouble swallowing. Sore throat. Dry cough. Asthma-like symptoms including chest tightness, shortness of breath, or wheezing. DIAGNOSIS  Your caregiver may be able to diagnose GERD based on your symptoms. In some cases, X-rays and other tests may be done to check for complications or to check the condition of your stomach and esophagus. TREATMENT  Your caregiver may recommend over-the-counter or prescription medicines to help decrease acid production. Ask your caregiver before starting or adding any new medicines.  HOME CARE INSTRUCTIONS  Change the factors that you can control. Ask your caregiver for guidance concerning weight loss, quitting smoking, and alcohol consumption. Avoid foods and drinks that make your symptoms worse, such as: Caffeine or alcoholic drinks. Chocolate. Peppermint or mint flavorings. Garlic and onions. Spicy foods. Citrus fruits, such as oranges, lemons, or limes. Tomato-based foods such as sauce, chili, salsa, and pizza. Fried and fatty foods. Avoid lying down for the 3 hours prior to your bedtime or prior to taking a nap. Eat small, frequent meals instead of large meals. Wear loose-fitting clothing. Do not wear anything tight around your waist that causes pressure on your stomach. Raise the head of your bed 6 to 8 inches with wood blocks to help you sleep. Extra pillows will not help. Only take over-the-counter or prescription medicines for pain, discomfort, or fever as directed by your caregiver. Do not take aspirin, ibuprofen, or other nonsteroidal anti-inflammatory drugs (NSAIDs). SEEK IMMEDIATE MEDICAL  CARE IF:  You have pain in your arms, neck, jaw, teeth, or back. Your pain increases or changes in intensity or duration. You develop nausea, vomiting, or sweating (diaphoresis). You develop shortness of breath, or you faint. Your vomit is green, yellow, black, or looks like coffee grounds or blood. Your stool is red, bloody, or black. These symptoms could be signs of other problems, such as heart disease, gastric bleeding, or esophageal bleeding. MAKE SURE YOU:  Understand these instructions. Will watch your condition. Will get help right away if you are not doing well or get worse. Document Released: 11/26/2004 Document Revised: 05/11/2011 Document Reviewed: 09/05/2010 [facility] Patient Information 2015 [place], [place]. This information is not intended to replace advice given to you by your health care provider. Make sure you discuss any questions you have with your health care provider.

## 2014-07-13 NOTE — Interval H&P Note (Signed)
History and Physical Interval Note:  07/13/2014 1:04 PM  [name]  has presented today for surgery, with the diagnosis of rectal bleeding/gerd  The various methods of treatment have been discussed with the patient and family. After consideration of risks, benefits and other options for treatment, the patient has consented to  Procedure(s) with comments: COLONOSCOPY (N/A) - 145 - moved to 1:15 - office to notify  ESOPHAGOGASTRODUODENOSCOPY (EGD) (N/A) as a surgical intervention .  The patient's history has been reviewed, patient examined, no change in status, stable for surgery.  I have reviewed the patient's chart and labs.  Questions were answered to the patient's satisfaction.     [name]  No change. EGD and colonoscopy per plan.  The risks, benefits, limitations, imponderables and alternatives regarding both EGD and colonoscopy have been reviewed with the patient. Questions have been answered. All parties agreeable.

## 2014-07-19 ENCOUNTER — Telehealth: Payer: Self-pay

## 2014-07-19 ENCOUNTER — Encounter (HOSPITAL_COMMUNITY): Payer: Self-pay | Admitting: Internal Medicine

## 2014-07-19 ENCOUNTER — Encounter: Payer: Self-pay | Admitting: Internal Medicine

## 2014-07-19 NOTE — Telephone Encounter (Signed)
Letter mailed to the pt. 

## 2014-07-19 NOTE — Telephone Encounter (Signed)
Per RMR-  Send letter to patient.  Send copy of letter with path to referring provider and PCP.    Patient needs to see a general surgeon in July to have the rectal lesion removed transanally.

## 2014-07-19 NOTE — Telephone Encounter (Signed)
Referral made and appointment is made for 08/06/14 @ 10:50. He is aware

## 2014-07-26 ENCOUNTER — Encounter (HOSPITAL_BASED_OUTPATIENT_CLINIC_OR_DEPARTMENT_OTHER): Payer: Self-pay | Admitting: *Deleted

## 2014-07-26 ENCOUNTER — Ambulatory Visit (INDEPENDENT_AMBULATORY_CARE_PROVIDER_SITE_OTHER): Payer: 59 | Admitting: Internal Medicine

## 2014-07-26 NOTE — Pre-Procedure Instructions (Signed)
To go to [facility] for BMET and EKG 

## 2014-07-31 ENCOUNTER — Encounter (HOSPITAL_COMMUNITY)
Admission: RE | Admit: 2014-07-31 | Discharge: 2014-07-31 | Disposition: A | Discharge status: Home / Self Care | Payer: 59 | Source: Ambulatory Visit | Attending: Otolaryngology | Admitting: Otolaryngology

## 2014-07-31 ENCOUNTER — Other Ambulatory Visit: Payer: Self-pay | Admitting: Otolaryngology

## 2014-07-31 DIAGNOSIS — F17210 Nicotine dependence, cigarettes, uncomplicated: Secondary | ICD-10-CM | POA: Diagnosis not present

## 2014-07-31 DIAGNOSIS — Z880 Allergy status to penicillin: Secondary | ICD-10-CM | POA: Diagnosis not present

## 2014-07-31 DIAGNOSIS — K219 Gastro-esophageal reflux disease without esophagitis: Secondary | ICD-10-CM | POA: Diagnosis not present

## 2014-07-31 DIAGNOSIS — J342 Deviated nasal septum: Secondary | ICD-10-CM | POA: Diagnosis present

## 2014-07-31 DIAGNOSIS — I10 Essential (primary) hypertension: Secondary | ICD-10-CM | POA: Diagnosis not present

## 2014-07-31 DIAGNOSIS — J45909 Unspecified asthma, uncomplicated: Secondary | ICD-10-CM | POA: Diagnosis not present

## 2014-07-31 DIAGNOSIS — J343 Hypertrophy of nasal turbinates: Secondary | ICD-10-CM | POA: Diagnosis not present

## 2014-07-31 DIAGNOSIS — M950 Acquired deformity of nose: Secondary | ICD-10-CM | POA: Diagnosis not present

## 2014-07-31 LAB — BASIC METABOLIC PANEL
Anion gap: 9 (ref 5–15)
BUN: 21 mg/dL — AB (ref 6–20)
CO2: 27 mmol/L (ref 22–32)
CREATININE: 1.01 mg/dL (ref 0.61–1.24)
Calcium: 9.6 mg/dL (ref 8.9–10.3)
Chloride: 104 mmol/L (ref 101–111)
GLUCOSE: 100 mg/dL — AB (ref 65–99)
Potassium: 4.3 mmol/L (ref 3.5–5.1)
Sodium: 140 mmol/L (ref 135–145)
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
## 2014-08-01 ENCOUNTER — Encounter (HOSPITAL_BASED_OUTPATIENT_CLINIC_OR_DEPARTMENT_OTHER)
Admission: RE | Disposition: A | Discharge status: Home / Self Care | Payer: Self-pay | Source: Ambulatory Visit | Attending: Otolaryngology

## 2014-08-01 ENCOUNTER — Ambulatory Visit (HOSPITAL_BASED_OUTPATIENT_CLINIC_OR_DEPARTMENT_OTHER): Payer: 59 | Admitting: Anesthesiology

## 2014-08-01 ENCOUNTER — Ambulatory Visit (HOSPITAL_BASED_OUTPATIENT_CLINIC_OR_DEPARTMENT_OTHER)
Admission: RE | Admit: 2014-08-01 | Discharge: 2014-08-01 | Disposition: A | Discharge status: Home / Self Care | Payer: 59 | Source: Ambulatory Visit | Attending: Otolaryngology | Admitting: Otolaryngology

## 2014-08-01 ENCOUNTER — Encounter (HOSPITAL_BASED_OUTPATIENT_CLINIC_OR_DEPARTMENT_OTHER): Payer: Self-pay | Admitting: *Deleted

## 2014-08-01 DIAGNOSIS — J45909 Unspecified asthma, uncomplicated: Secondary | ICD-10-CM | POA: Insufficient documentation

## 2014-08-01 DIAGNOSIS — K219 Gastro-esophageal reflux disease without esophagitis: Secondary | ICD-10-CM | POA: Insufficient documentation

## 2014-08-01 DIAGNOSIS — M950 Acquired deformity of nose: Secondary | ICD-10-CM | POA: Insufficient documentation

## 2014-08-01 DIAGNOSIS — J343 Hypertrophy of nasal turbinates: Secondary | ICD-10-CM | POA: Insufficient documentation

## 2014-08-01 DIAGNOSIS — F17210 Nicotine dependence, cigarettes, uncomplicated: Secondary | ICD-10-CM | POA: Insufficient documentation

## 2014-08-01 DIAGNOSIS — I10 Essential (primary) hypertension: Secondary | ICD-10-CM | POA: Insufficient documentation

## 2014-08-01 DIAGNOSIS — J342 Deviated nasal septum: Secondary | ICD-10-CM | POA: Diagnosis not present

## 2014-08-01 DIAGNOSIS — Z880 Allergy status to penicillin: Secondary | ICD-10-CM | POA: Insufficient documentation

## 2014-08-01 HISTORY — DX: Deviated nasal septum: J34.2

## 2014-08-01 HISTORY — DX: Dental restoration status: Z98.811

## 2014-08-01 HISTORY — DX: Acquired deformity of nose: M95.0

## 2014-08-01 HISTORY — DX: Hypertrophy of nasal turbinates: J34.3

## 2014-08-01 HISTORY — PX: NASAL SEPTOPLASTY W/ TURBINOPLASTY: SHX2070

## 2014-08-01 HISTORY — DX: Unspecified asthma, uncomplicated: J45.909

## 2014-08-01 LAB — POCT HEMOGLOBIN-HEMACUE: Hemoglobin: 17 g/dL (ref 13.0–17.0)

## 2014-08-01 SURGERY — SEPTOPLASTY, NOSE, WITH NASAL TURBINATE REDUCTION
Anesthesia: General | Site: Nose

## 2014-08-01 MED ORDER — SUCCINYLCHOLINE CHLORIDE 20 MG/ML IJ SOLN
INTRAMUSCULAR | Status: DC | PRN
  Administered 2014-08-01: 140 mg via INTRAVENOUS

## 2014-08-01 MED ORDER — OXYMETAZOLINE HCL 0.05 % NA SOLN
2.0000 | NASAL | Status: AC
  Administered 2014-08-01: 2 via NASAL

## 2014-08-01 MED ORDER — SUFENTANIL CITRATE 50 MCG/ML IV SOLN
INTRAVENOUS | Status: DC | PRN
  Administered 2014-08-01: 10 ug via INTRAVENOUS
  Administered 2014-08-01: 15 ug via INTRAVENOUS

## 2014-08-01 MED ORDER — ONDANSETRON HCL 4 MG/2ML IJ SOLN
INTRAMUSCULAR | Status: DC | PRN
  Administered 2014-08-01: 4 mg via INTRAVENOUS

## 2014-08-01 MED ORDER — BACITRACIN-NEOMYCIN-POLYMYXIN OINTMENT TUBE
TOPICAL_OINTMENT | CUTANEOUS | Status: DC | PRN
  Administered 2014-08-01: 1 via TOPICAL

## 2014-08-01 MED ORDER — HYDROMORPHONE HCL 1 MG/ML IJ SOLN: 0.2500 mg | INTRAMUSCULAR | Status: DC | PRN

## 2014-08-01 MED ORDER — MEPERIDINE HCL 25 MG/ML IJ SOLN: 6.2500 mg | INTRAMUSCULAR | Status: DC | PRN

## 2014-08-01 MED ORDER — OXYCODONE HCL 5 MG PO TABS: 5.0000 mg | ORAL_TABLET | Freq: Once | ORAL | Status: DC | PRN

## 2014-08-01 MED ORDER — LIDOCAINE HCL (CARDIAC) 20 MG/ML IV SOLN
INTRAVENOUS | Status: DC | PRN
  Administered 2014-08-01: 100 mg via INTRAVENOUS

## 2014-08-01 MED ORDER — LIDOCAINE-EPINEPHRINE 1 %-1:100000 IJ SOLN
INTRAMUSCULAR | Status: DC | PRN
  Administered 2014-08-01: 18 mL

## 2014-08-01 MED ORDER — MIDAZOLAM HCL 2 MG/2ML IJ SOLN
1.0000 mg | INTRAMUSCULAR | Status: DC | PRN
Start: 2014-08-01 — End: 2014-08-01
  Administered 2014-08-01: 2 mg via INTRAVENOUS

## 2014-08-01 MED ORDER — FENTANYL CITRATE (PF) 100 MCG/2ML IJ SOLN: 50.0000 ug | INTRAMUSCULAR | Status: DC | PRN

## 2014-08-01 MED ORDER — OXYMETAZOLINE HCL 0.05 % NA SOLN
NASAL | Status: DC | PRN
  Administered 2014-08-01: 1 via TOPICAL

## 2014-08-01 MED ORDER — LIDOCAINE-EPINEPHRINE 1 %-1:100000 IJ SOLN
INTRAMUSCULAR | Status: AC
  Filled 2014-08-01: qty 1

## 2014-08-01 MED ORDER — BACITRACIN ZINC 500 UNIT/GM EX OINT
TOPICAL_OINTMENT | CUTANEOUS | Status: AC
  Filled 2014-08-01: qty 28.35

## 2014-08-01 MED ORDER — CEFAZOLIN SODIUM-DEXTROSE 2-3 GM-% IV SOLR
INTRAVENOUS | Status: AC
  Filled 2014-08-01: qty 50

## 2014-08-01 MED ORDER — GLYCOPYRROLATE 0.2 MG/ML IJ SOLN: 0.2000 mg | Freq: Once | INTRAMUSCULAR | Status: DC | PRN

## 2014-08-01 MED ORDER — OXYCODONE HCL 5 MG/5ML PO SOLN: 5.0000 mg | Freq: Once | ORAL | Status: DC | PRN

## 2014-08-01 MED ORDER — CEFAZOLIN SODIUM-DEXTROSE 2-3 GM-% IV SOLR
2.0000 g | INTRAVENOUS | Status: AC
  Administered 2014-08-01: 2 g via INTRAVENOUS

## 2014-08-01 MED ORDER — OXYMETAZOLINE HCL 0.05 % NA SOLN
NASAL | Status: AC
  Filled 2014-08-01: qty 15

## 2014-08-01 MED ORDER — LACTATED RINGERS IV SOLN
INTRAVENOUS | Status: DC
  Administered 2014-08-01 (×2): via INTRAVENOUS

## 2014-08-01 MED ORDER — MIDAZOLAM HCL 2 MG/2ML IJ SOLN
INTRAMUSCULAR | Status: AC
  Filled 2014-08-01: qty 2

## 2014-08-01 MED ORDER — EPHEDRINE SULFATE 50 MG/ML IJ SOLN
INTRAMUSCULAR | Status: DC | PRN
  Administered 2014-08-01 (×2): 10 mg via INTRAVENOUS
  Administered 2014-08-01: 15 mg via INTRAVENOUS

## 2014-08-01 MED ORDER — DEXAMETHASONE SODIUM PHOSPHATE 4 MG/ML IJ SOLN
INTRAMUSCULAR | Status: DC | PRN
  Administered 2014-08-01: 10 mg via INTRAVENOUS

## 2014-08-01 MED ORDER — SUFENTANIL CITRATE 50 MCG/ML IV SOLN
INTRAVENOUS | Status: AC
  Filled 2014-08-01: qty 1

## 2014-08-01 MED ORDER — PROPOFOL 10 MG/ML IV BOLUS
INTRAVENOUS | Status: DC | PRN
  Administered 2014-08-01: 300 mg via INTRAVENOUS

## 2014-08-01 SURGICAL SUPPLY — 41 items
AIRWAY NASO PHAR 26FR 6.5 (TUBING)
AIRWAY NASOPHAR 26 6.5 (TUBING) IMPLANT
ATTRACTOMAT 16X20 MAGNETIC DRP (DRAPES) IMPLANT
CANISTER SUCT 1200ML W/VALVE (MISCELLANEOUS) ×3 IMPLANT
COAGULATOR SUCT 8FR VV (MISCELLANEOUS) IMPLANT
DECANTER SPIKE VIAL GLASS SM (MISCELLANEOUS) IMPLANT
DEPRESSOR TONGUE BLADE STERILE (MISCELLANEOUS) ×6 IMPLANT
DRSG NASOPORE 8CM (GAUZE/BANDAGES/DRESSINGS) IMPLANT
DRSG TELFA 3X8 NADH (GAUZE/BANDAGES/DRESSINGS) IMPLANT
ELECT REM PT RETURN 9FT ADLT (ELECTROSURGICAL) ×3
ELECTRODE REM PT RTRN 9FT ADLT (ELECTROSURGICAL) ×1 IMPLANT
GAUZE PACKING FOLDED 2  STR (GAUZE/BANDAGES/DRESSINGS) ×2
GAUZE PACKING FOLDED 2 STR (GAUZE/BANDAGES/DRESSINGS) ×1 IMPLANT
GLOVE BIO SURGEON STRL SZ 6.5 (GLOVE) ×2 IMPLANT
GLOVE BIO SURGEONS STRL SZ 6.5 (GLOVE) ×1
GLOVE BIOGEL PI IND STRL 7.0 (GLOVE) ×1 IMPLANT
GLOVE BIOGEL PI INDICATOR 7.0 (GLOVE) ×2
GLOVE ECLIPSE 8.0 STRL XLNG CF (GLOVE) ×6 IMPLANT
GOWN STRL REUS W/ TWL LRG LVL3 (GOWN DISPOSABLE) ×1 IMPLANT
GOWN STRL REUS W/ TWL XL LVL3 (GOWN DISPOSABLE) ×1 IMPLANT
GOWN STRL REUS W/TWL LRG LVL3 (GOWN DISPOSABLE) ×2
GOWN STRL REUS W/TWL XL LVL3 (GOWN DISPOSABLE) ×2
KIT SPLINT NASAL DENVER PET BE (GAUZE/BANDAGES/DRESSINGS) ×3 IMPLANT
NEEDLE HYPO 25X1 1.5 SAFETY (NEEDLE) ×3 IMPLANT
NEEDLE SPNL 25GX3.5 QUINCKE BL (NEEDLE) ×3 IMPLANT
NS IRRIG 1000ML POUR BTL (IV SOLUTION) IMPLANT
PACK BASIN DAY SURGERY FS (CUSTOM PROCEDURE TRAY) ×3 IMPLANT
PACK ENT DAY SURGERY (CUSTOM PROCEDURE TRAY) ×3 IMPLANT
PATTIES SURGICAL .5 X3 (DISPOSABLE) ×3 IMPLANT
SLEEVE SCD COMPRESS KNEE MED (MISCELLANEOUS) IMPLANT
SPONGE GAUZE 2X2 8PLY STER LF (GAUZE/BANDAGES/DRESSINGS) ×1
SPONGE GAUZE 2X2 8PLY STRL LF (GAUZE/BANDAGES/DRESSINGS) ×2 IMPLANT
SUT CHROMIC 3 0 PS 2 (SUTURE) IMPLANT
SUT CHROMIC 4 0 P 3 18 (SUTURE) ×3 IMPLANT
SUT ETHILON 3 0 PS 1 (SUTURE) ×3 IMPLANT
SUT PDS AB 4-0 P3 18 (SUTURE) ×3 IMPLANT
SUT PLAIN 4 0 ~~LOC~~ 1 (SUTURE) IMPLANT
SUT VIC AB 3-0 FS2 27 (SUTURE) IMPLANT
TOWEL OR 17X24 6PK STRL BLUE (TOWEL DISPOSABLE) ×6 IMPLANT
TRAY DSU PREP LF (CUSTOM PROCEDURE TRAY) ×3 IMPLANT
YANKAUER SUCT BULB TIP NO VENT (SUCTIONS) ×3 IMPLANT

## 2014-08-01 NOTE — Anesthesia Postprocedure Evaluation (Signed)
  Anesthesia Post-op Note  Patient: [name]  Procedure(s) Performed: Procedure(s): FUNCTIONAL SEPTORHINOPLASTY SUBMUCOUS RESECTION INFERIOR TURBINATE  (N/A)  Patient Location: PACU  Anesthesia Type: General   Level of Consciousness: awake, alert  and oriented  Airway and Oxygen Therapy: Patient Spontanous Breathing  Post-op Pain: mild  Post-op Assessment: Post-op Vital signs reviewed  Post-op Vital Signs: Reviewed  Last Vitals:  Filed Vitals:   08/01/14 1231  BP: 149/95  Pulse: 83  Temp: 36.6 C  Resp: 16    Complications: No apparent anesthesia complications

## 2014-08-01 NOTE — Anesthesia Procedure Notes (Signed)
Procedure Name: Intubation Date/Time: 08/01/2014 9:27 AM Performed by: Zenia ResidesPAYNE, Jasmin Winberry D Pre-anesthesia Checklist: Patient identified, Emergency Drugs available, Suction available and Patient being monitored Patient Re-evaluated:Patient Re-evaluated prior to inductionOxygen Delivery Method: Circle System Utilized Preoxygenation: Pre-oxygenation with 100% oxygen Intubation Type: IV induction Ventilation: Mask ventilation without difficulty Laryngoscope Size: Mac and 3 Grade View: Grade I Tube type: Oral Tube size: 8.0 mm Number of attempts: 1 Airway Equipment and Method: Stylet and Oral airway Placement Confirmation: ETT inserted through vocal cords under direct vision,  positive ETCO2 and breath sounds checked- equal and bilateral Secured at: 23 cm Tube secured with: Tape Dental Injury: Teeth and Oropharynx as per pre-operative assessment

## 2014-08-01 NOTE — Op Note (Signed)
08/01/2014  11:16 AM    Elizebeth Koller  [id number]   Pre-Op Dx:  Deviated Nasal Septum,  acquired deformity external nose, Hypertrophic Inferior Turbinates  Post-op Dx: Same  Proc: Functional septorhinoplasty, Bilateral SMR Inferior Turbinates   Surg:  Flo Shanks T MD  Anes:  GOT  EBL:  Minimal  Comp:  None  Findings:  External nose deviated towards the left concave towards the right. Good dorsal support. Internally, a severely buckled nasal septum into the right nasal chamber with prominent left maxillary crest spurring and a tall maxillary crest.  The caudal edge of the quadrangular cartilage presenting into the right nasal vestibule.  Bulky inferior turbinates, right greater than left.  Procedure: With the patient in a comfortable supine position,  general orotracheal anesthesia was induced without difficulty.     The patient received preoperative Afrin spray for topical decongestion and vasoconstriction.  Intravenous prophylactic antibiotics were administered.  At an appropriate level, the patient was placed in a semi-sitting position.  A saline moistened throat pack was placed.  Nasal vibrissae were trimmed.  Afrin solution was applied on 0.5 x 3" cottonoids to both sides of the nasal septum. 1% Xylocaine with 1:100,000 epinephrine, 9 mls total, was infiltrated into the anterior floor of the nose, into the nasal spine region, into the membranous columella, into the intercartilaginous region on both sides, and finally into the submucoperichondrial plane of the septum on both sides.  Several minutes were allowed for this to take effect.  A sterile preparation and draping of the midface was accomplished in the standard fashion.  The materials were removed from the nose and observed to be intact and correct in number.  The nose was inspected with a headlight with the findings as described above.  A LEFT hemitransfixion incision was sharply executed and carried down to the  caudal edge of the quadrangular cartilage and continued to a floor incision.  An opposite small floor incision was sharply executed as well.   Floor tunnels were elevated on both sides, carried posteriorly, then medially, then brought forward along the vomer and maxillary crest.  The submucoperichondrial plane of the  LEFT septum was dissected up to the dorsum of the nose, back onto the perpendicular plate, and brought down and communicated with a floor tunnel and then forward along the maxillary crest.  The flap was generated intact.  The chondroethmoid junction was identified and opened with a Risk analyst.  The opposite submucoperiosteal plane of the perpendicular plate of the ethmoid  was elevated and carried down to the floor tunnel posteriorly.  The superior perpendicular plate was lysed with an open Jansen-Middleton forceps.  The inferior portion was dissected from the maxillary crest and vomer with a Cottle elevator.  The midportion was rocked free with a closed [employer] forceps and then delivered.    The posterior inferior corner of the quadrangular cartilage was submucosally resected, including a cartilaginous tail up along the vomer.     A 2 mm strip of the inferior caudal strut was submucosally resected. The maxillary crest was lowered and removed submucosally using a mallet and osteotome. The septum was separated from the upper lateral cartilages sharply on both sides. This allowed it to swing freely into the midline. A buckle convex into the left was controlled with a 1 mm wide strip of cartilage resection.  The free edges were controlled with figure-of-eight 4-0 chromic sutures to prevent imbrication. A tunnel was executed between the medial crura rate of the lower  lateral cartilages. The caudal septum was talked into this pocket and secured thereto with a 4-0 PDS suture.  After mobilizing the septum adequately, and straightening it in the standard fashion,  The septum was secured  to the nasal spine with a figure-of-eight 4-0 PDS suture.  A good straight midline configuration of the septum with good dorsal support was generated.  The septal tunnel was suctioned clear.  Hemostasis was observed.  The flaps were laid back down.  The incisions were closed with interrupted 4-0 chromic suture.  An intercartilaginous incision was sharply executed on each side and the dorsal skin was elevated using Jomarie LongsJoseph scissors. The 2 tunnels were communicated and midline. Small sharp incisions were made in the lateral piriform aperture. Using a 4 mm osteotome, median fading osteotomies were made one on each side and then lateral osteotomies at the nasofacial groove were made and communicated with the midline osteotomies. The nasal bones were mobilized on both sides. Hemostasis was observed.  Just prior to completing the septoplasty, the inferior turbinates were each infiltrated with additional 1% Xylocaine with 1:100,000 epinephrine,  6 cc's total.  Upon completing the septoplasty, beginning on the RIGHT side, the inferior turbinate was inspected and infractured.  The anterior hood of the inferior turbinate was sharply lysed just behind the nasal valve.  The medial mucosa of the inferior turbinate was incised in an  anterior upsloping fashion and a laterally based flap was developed from the turbinate bone.  Using angled turbinate scissors, turbinate bone and lateral mucosa were resected in a posterior downsloping fashion, taking much of the anterior pole and leaving most of the posterior pole.  Bony spicules were submucosally dissected and removed.  The mucosal flap was laid back down and the turbinate was outfractured.  This completed one SMR inferior turbinate.  The opposite side was performed in identical fashion.  Again hemostasis was observed.  After completing both turbinate resections, 0.040" reinforced Silastic splints were fashioned, placed against the nasal septum for support, and secured  thereto with a 3-0 Ethilon stitch.   Telfa packs impregnated with bacitracin ointment were placed between the septum and the inferior turbinates, one on each side, for hemostasis and support.    The external nose was cleaned with alcohol, painted with skin prep, and then 1/2 inch Steri-Strips were applied in the standard fashion. A petite [place] splint was fashioned including the foam rubber strip and applied to the nose and compressed slightly for support.  Hemostasis was observed.   At this point the procedure was completed.  The pharynx was suctioned free and the throat pack was removed.   The patient was returned to anesthesia, awakened, extubated, and transferred to recovery in stable condition.  Dispo:   PACU to home  Plan: Ice, elevation, narcotic analgesia, prophylactic antibiotics for the duration of indwelling nasal foreign bodies.  We will remove the nasal packing In one day, the septal splints in 10 days.  Return to work or school in 10 days, strenuous activities in two weeks.  Cephus RicherWOLICKI,  Mekisha Bittel T MD

## 2014-08-01 NOTE — Transfer of Care (Signed)
Immediate Anesthesia Transfer of Care Note  Patient: [name]  Procedure(s) Performed: Procedure(s): FUNCTIONAL SEPTORHINOPLASTY SUBMUCOUS RESECTION INFERIOR TURBINATE  (N/A)  Patient Location: PACU  Anesthesia Type:General  Level of Consciousness: awake  Airway & Oxygen Therapy: Patient Spontanous Breathing and Patient connected to face mask oxygen  Post-op Assessment: Report given to RN and Post -op Vital signs reviewed and stable  Post vital signs: Reviewed and stable  Last Vitals:  Filed Vitals:   08/01/14 0835  BP: 148/98  Pulse: 72  Temp: 36.8 C  Resp: 20    Complications: No apparent anesthesia complications
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
## 2014-08-01 NOTE — Interval H&P Note (Signed)
History and Physical Interval Note:  08/01/2014 8:59 AM  [name]  has presented today for surgery, with the diagnosis of DEVIATED NASAL SEPTUM  HYPERTROPHIC TURBINATE  ACQUIRED DEFORMITY NOSE    The various methods of treatment have been discussed with the patient and family. After consideration of risks, benefits and other options for treatment, the patient has consented to  Procedure(s): FUNCTIONAL SEPTORHINOPLASTY SUBMUCOUS RESECTION INFERIOR TURBINATE  (N/A) as a surgical intervention .  The patient's history has been re-reviewed, patient re-examined, no change in status, stable for surgery.  I have re-reviewed the patient's chart and labs.  Questions were answered to the patient's satisfaction.     Flo ShanksWOLICKI, Neizan Debruhl

## 2014-08-01 NOTE — Anesthesia Preprocedure Evaluation (Signed)
Anesthesia Evaluation  Patient identified by MRN, date of birth, ID band Patient awake    Reviewed: Allergy & Precautions, NPO status , Patient's Chart, lab work & pertinent test results  Airway Mallampati: I  TM Distance: >3 FB Neck ROM: Full    Dental  (+) Teeth Intact, Dental Advisory Given   Pulmonary asthma , Current Smoker,  breath sounds clear to auscultation        Cardiovascular hypertension, Pt. on medications Rhythm:Regular Rate:Normal     Neuro/Psych    GI/Hepatic GERD-  Medicated and Controlled,  Endo/Other    Renal/GU      Musculoskeletal   Abdominal   Peds  Hematology   Anesthesia Other Findings   Reproductive/Obstetrics                             Anesthesia Physical Anesthesia Plan  ASA: II  Anesthesia Plan: General   Post-op Pain Management:    Induction: Intravenous  Airway Management Planned: Oral ETT  Additional Equipment:   Intra-op Plan:   Post-operative Plan: Extubation in OR  Informed Consent: I have reviewed the patients History and Physical, chart, labs and discussed the procedure including the risks, benefits and alternatives for the proposed anesthesia with the patient or authorized representative who has indicated his/her understanding and acceptance.   Dental advisory given  Plan Discussed with: CRNA, Anesthesiologist and Surgeon  Anesthesia Plan Comments:         Anesthesia Quick Evaluation

## 2014-08-01 NOTE — Discharge Instructions (Signed)
Nasal Septal Reconstruction °Nasal septal reconstruction or nasal septoplasty is a procedure to straighten the nasal septum. The nasal septum is a wall that separates the two nostrils and nasal passages. It is slightly off center in most people. If the septum is severely deviated, it may result in problems, such as difficulty in breathing through the nose. The bend in the septum may be present at birth or could be due to an injury. This procedure is done if you have any of the following problems. °· Deviation of the nasal septum. °· Repeated infection of the sinuses (air-filled cavities in the skull). °· Pain due to the deviated septum. °· Loss of smell due to the deviated septum. °· A blood clot in the septum that interferes your breathing. °· Frequent nosebleeds. °If the outside of the nose is bent, it may have to be reconstructed by a surgery called rhinoplasty. Sometimes, this procedure may be combined with septoplasty. °LET YOUR CAREGIVER KNOW ABOUT:  °· Allergies. °· Previous problems with anesthetics. °· History of bleeding or blood problems. °· Any medicines that you are currently taking. °RISKS AND COMPLICATIONS °· You may have a hole in the septum. °· You may have a collection of blood in the septum. °· You may develop loss of sensation in the upper lip or teeth. °· You may develop an infection. °· You may have bleeding. °· The front portion of your nose may become flatter than what it was before the procedure. °· You may develop a reaction to the anesthetic used. °· You may have a recurrence of the nasal obstruction. °BEFORE THE PROCEDURE  °· Follow the instructions given by your caregiver. °· Your caregiver may recommend x-ray and blood tests. °· Your caregiver may advise you to stop smoking for at least 2 weeks before the procedure. °· Your caregiver may advise you to stop taking aspirin and anti-inflammatory medications such as ibuprofen, 10 days before surgery, as these medicines can cause  bleeding. °If the surgery is going to be under general anesthesia: °· You may be advised to eat only a light meal, such as soup or salad the previous night. °· You will be advised to avoid eating or drinking anything after midnight and also in the morning of the procedure. °PROCEDURE  °If the procedure is being done under general anesthesia, you may be put to sleep. You will not feel the pain. You will not be aware of the procedure. It can also be done under local anesthesia with sedation where the area of the surgery is numbed. The surgeon then makes a cut on the inner lining of the septum. If there is a blood clot, it is drained. The bone and cartilage of the septum are reshaped. The straightened septum is held in place using plastic sheets or splints. Your nose is then packed with gauze to control the bleeding. The procedure may take one to one and a half hours. It generally does not cause bruising or black eyes. °AFTER THE PROCEDURE  °· You may be kept in the recovery room till the effect of the anesthesia wears off. °· You may be then brought to your room in the hospital. °· You may be asked to breathe through your mouth. °· Your nose packing may need to stay in place for 3 to 4 days. °· You may be given medicines for discomfort and nausea. °· You may be given antibiotics. °· You may be allowed to go home on the same day or have to   stay in the hospital for a few days. HOME CARE INSTRUCTIONS   Do not blow your nose.  Avoid doing heavy work and strenuous exercise for at least one week after the procedure.  Avoid pushing or moving your nose before it heals.  Avoid lifting weight and bending forwards.  Avoid using products that contain aspirin.  Keep your head raised while lying down.  Take the medicines as instructed by your caregiver.  Inform your caregiver if you have any problems after taking your medicine. SEEK MEDICAL CARE IF:   You have a new symptom.  You have doubts regarding the  procedure or its outcome. SEEK IMMEDIATE MEDICAL CARE IF:   You develop fever over 102 F (38.9 C).  You have severe difficulty in breathing.  Your nose continues to bleed even after you keep your head raised and apply ice to your forehead and nose for 10 to 15 minutes. Document Released: 05/26/2007 Document Revised: 05/11/2011 Document Reviewed: 05/26/2007 [facility] Patient Information 2015 [employer], [place]. This information is not intended to replace advice given to you by your health care provider. Make sure you discuss any questions you have with your health care provider.  Ice pack x 24 hrs Sleep with head elevated 3-4 nights RInse throat with cool dilute salt water every 2 hrs while awake, after meals, and as desired. Recheck my office tomorrow for pack removal.  Take pain medication before this visit and someone should drive you. You can alternate narcotic pain medications with Ibuprofen every 2 hrs. Drip as needed for drainage, but you can stop when it is no longer draining. We will give you nasal hygiene instructions tomorrow which you will use after the packs are removed. Call for bleeding, signs of infection, [phone] Keep the external splint dry.  If it falls off before 7 days, tape it back in place day and night.  After 7 days, at night only.  I will remove this and the internal splints next Friday (9 days)   Post Anesthesia Home Care Instructions  Activity: Get plenty of rest for the remainder of the day. A responsible adult should stay with you for 24 hours following the procedure.  For the next 24 hours, DO NOT: -Drive a car -Advertising copywriterperate machinery -Drink alcoholic beverages -Take any medication unless instructed by your physician -Make any legal decisions or sign important papers.  Meals: Start with liquid foods such as gelatin or soup. Progress to regular foods as tolerated. Avoid greasy, spicy, heavy foods. If nausea and/or vomiting occur, drink only clear liquids until  the nausea and/or vomiting subsides. Call your physician if vomiting continues.  Special Instructions/Symptoms: Your throat may feel dry or sore from the anesthesia or the breathing tube placed in your throat during surgery. If this causes discomfort, gargle with warm salt water. The discomfort should disappear within 24 hours.  If you had a scopolamine patch placed behind your ear for the management of post- operative nausea and/or vomiting:  1. The medication in the patch is effective for 72 hours, after which it should be removed.  Wrap patch in a tissue and discard in the trash. Wash hands thoroughly with soap and water. 2. You may remove the patch earlier than 72 hours if you experience unpleasant side effects which may include dry mouth, dizziness or visual disturbances. 3. Avoid touching the patch. Wash your hands with soap and water after contact with the patch.   Call your surgeon if you experience:   1.  Fever over  101.0. 2.  Inability to urinate. 3.  Nausea and/or vomiting. 4.  Extreme swelling or bruising at the surgical site. 5.  Continued bleeding from the incision. 6.  Increased pain, redness or drainage from the incision. 7.  Problems related to your pain medication. 8. Any change in color, movement and/or sensation 9. Any problems and/or concerns

## 2014-08-02 ENCOUNTER — Encounter (HOSPITAL_BASED_OUTPATIENT_CLINIC_OR_DEPARTMENT_OTHER): Payer: Self-pay | Admitting: Otolaryngology

## 2016-09-23 DIAGNOSIS — M25571 Pain in right ankle and joints of right foot: Secondary | ICD-10-CM | POA: Diagnosis not present

## 2016-09-23 DIAGNOSIS — G8929 Other chronic pain: Secondary | ICD-10-CM | POA: Diagnosis not present

## 2016-11-12 DIAGNOSIS — M79671 Pain in right foot: Secondary | ICD-10-CM | POA: Diagnosis not present

## 2016-11-12 DIAGNOSIS — B078 Other viral warts: Secondary | ICD-10-CM | POA: Diagnosis not present

## 2016-11-26 DIAGNOSIS — B078 Other viral warts: Secondary | ICD-10-CM | POA: Diagnosis not present

## 2016-11-26 DIAGNOSIS — M79671 Pain in right foot: Secondary | ICD-10-CM | POA: Diagnosis not present

## 2017-03-20 IMAGING — MR MR SHOULDER*R* W/O CM
4 of 6 series · 19 of 40 positions shown · non-contrast
Comparison: 12/11/2003

CLINICAL DATA: Pain and popping of the right shoulder of the past 5
years.

EXAM:
MRI OF THE RIGHT SHOULDER WITHOUT CONTRAST
TECHNIQUE: Multiplanar, multisequence MR imaging of the shoulder was performed.
No intravenous contrast was administered.

[Series 3: t2fs axial · axial · 3.0mm · 0.26mm/px · z∈[+1,+57]mm · 3 of 26 slices shown]
[im 5/26]
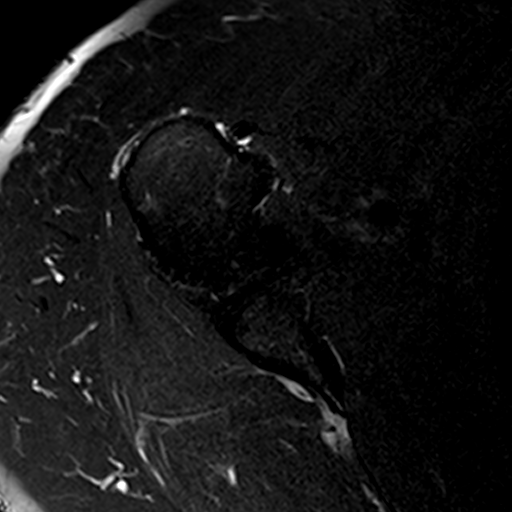
[im 13/26]
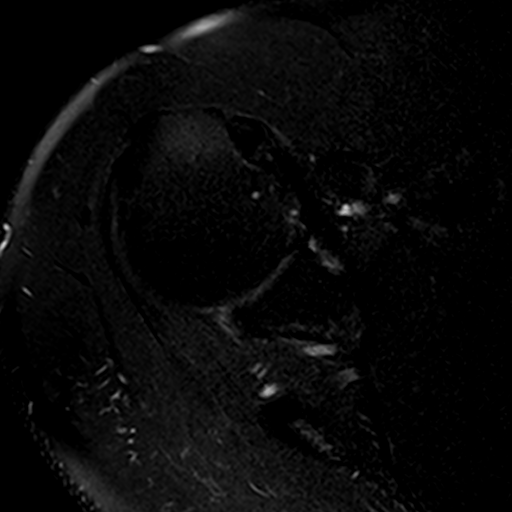
[im 21/26]
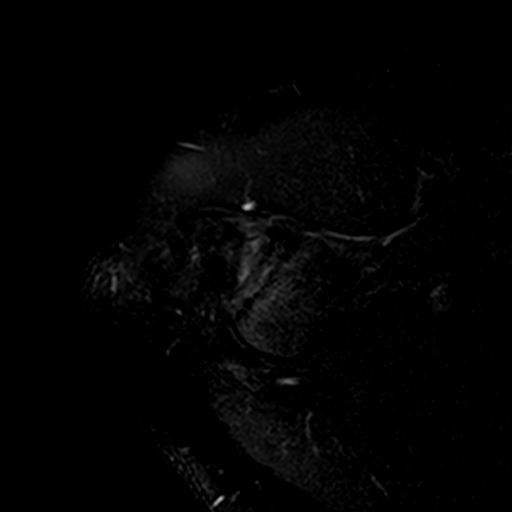

[Series 4: t2fs coronal · sagittal · 3.0mm · 0.27mm/px · 3 of 22 slices shown]
[im 5/22]
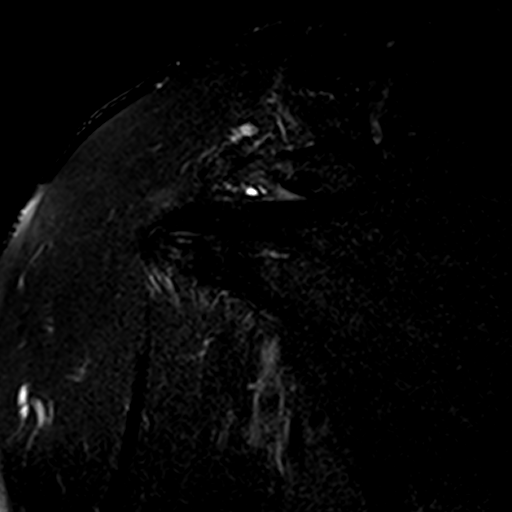
[im 13/22]
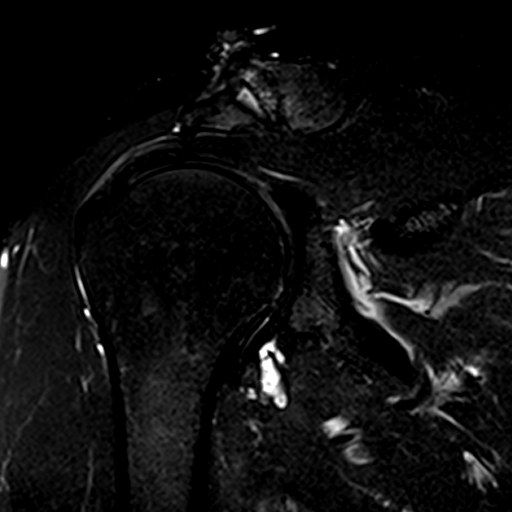
[im 22/22]
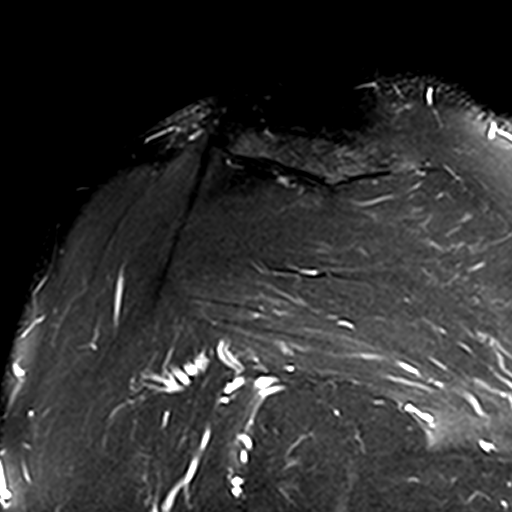

[Series 5: PD · sagittal · 3.0mm · 0.26mm/px · 6 of 22 slices shown]
[im 1/22]
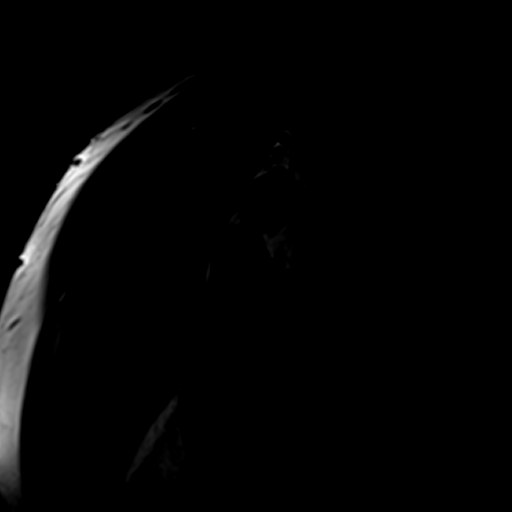
[im 5/22]
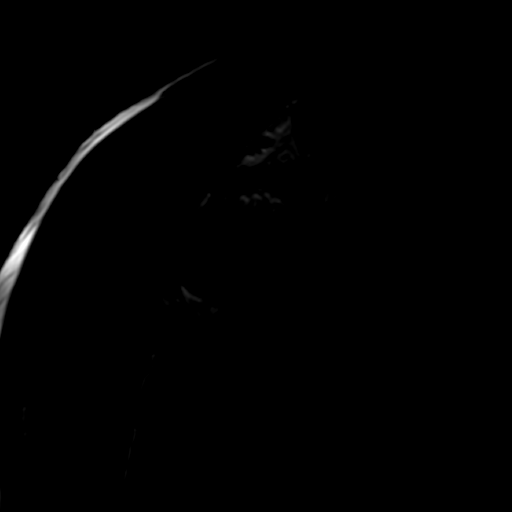
[im 9/22]
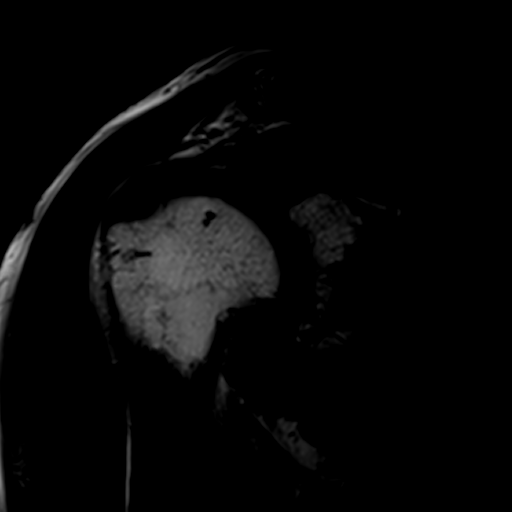
[im 13/22]
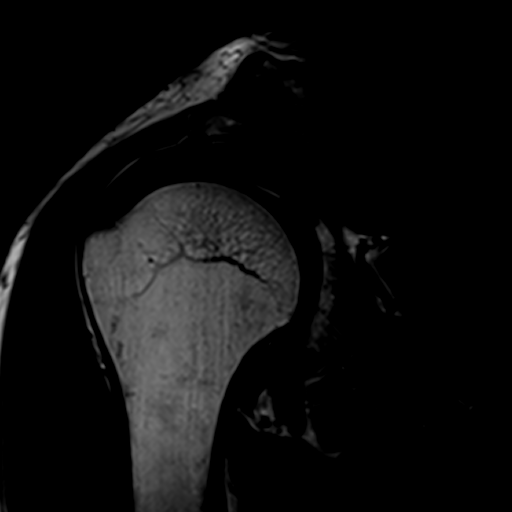
[im 17/22]
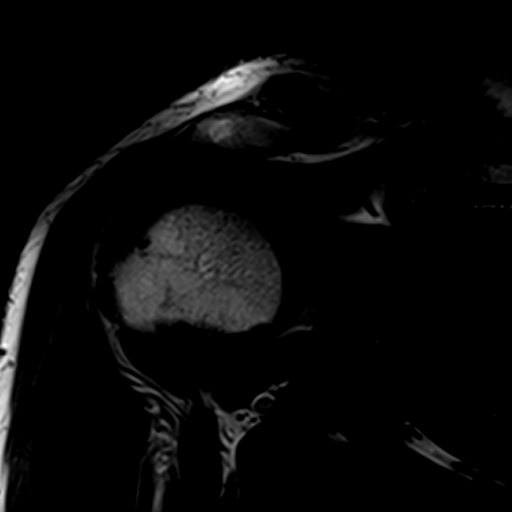
[im 22/22]
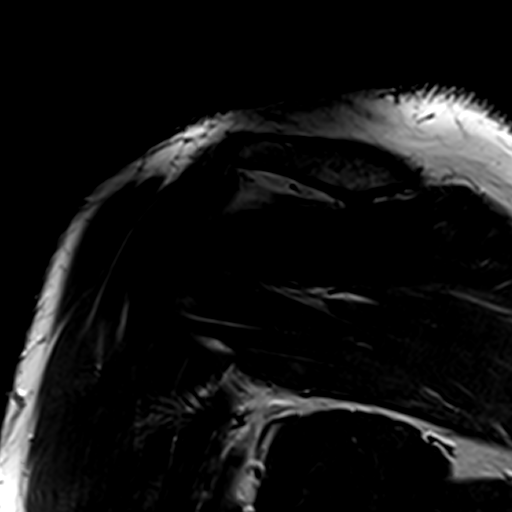

[Series 6: T1 · coronal · 3.0mm · 0.24mm/px · 7 of 26 slices shown]
[im 1/26]
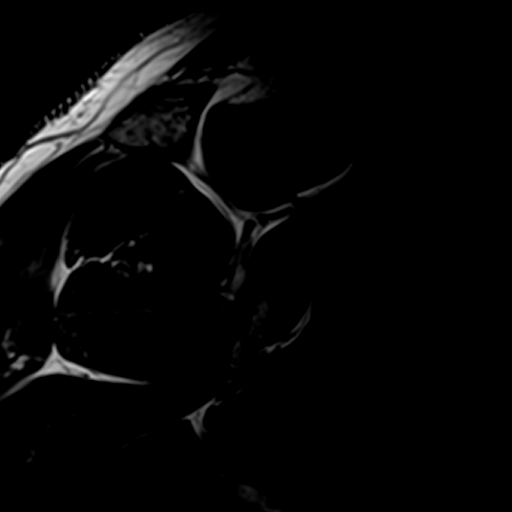
[im 5/26]
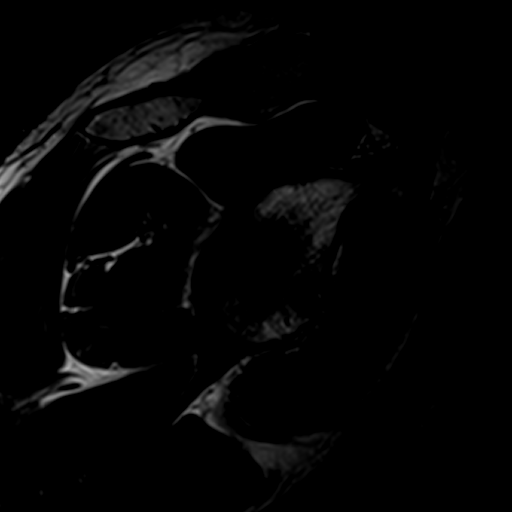
[im 9/26]
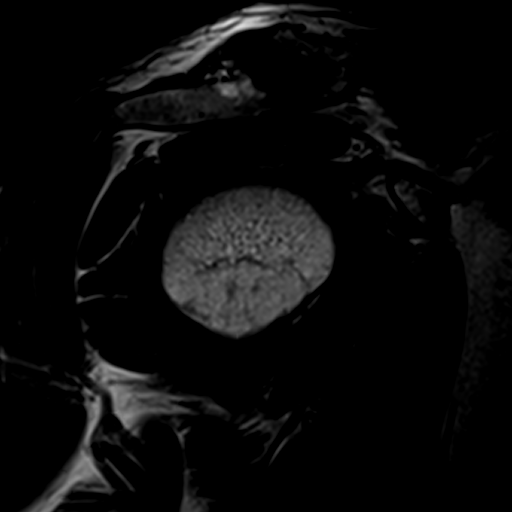
[im 13/26]
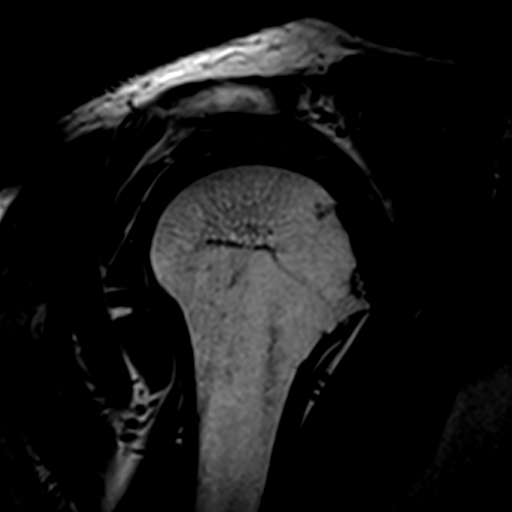
[im 17/26]
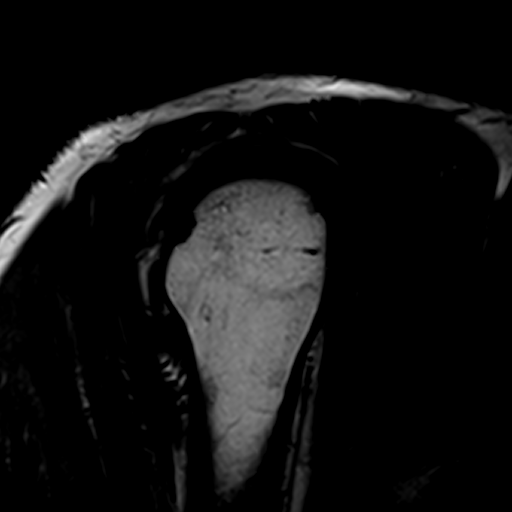
[im 21/26]
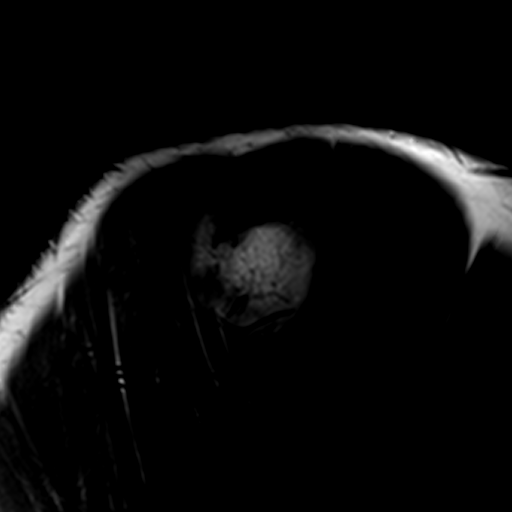
[im 26/26]
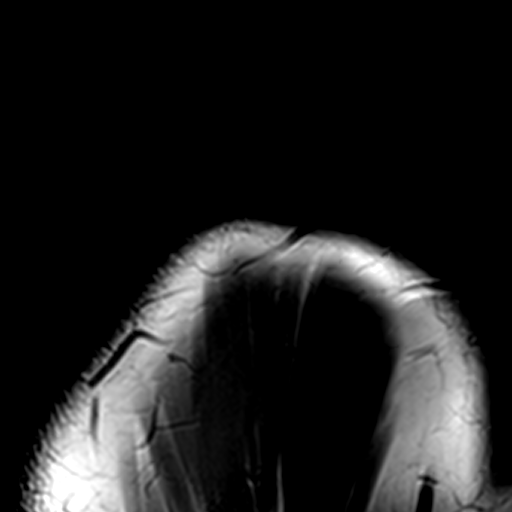

[19 of 40 positions shown; findings below may reference images not displayed]

FINDINGS: Rotator cuff:  Unremarkable

Muscles:  Subtle teres minor edema.

Biceps long head: Minimal tendinopathy of the intra-articular
segment.

Acromioclavicular Joint: Moderate degenerative AC joint arthropathy
with associated spurring and marrow edema. The acromial undersurface
is type 1 (flat).

Glenohumeral Joint: Minimal degenerative spurring of the glenoid and
humeral head with mild thinning of the articular cartilage.

Labrum: 1.6 by 0.7 by 0.5 cm fluid signal intensity collection
extending inferiorly from the inferior labrum on image 21 series 7.
As best I can tell this is different from the surrounding venous
structures and accordingly is probably not a venous varix. A small
paralabral cyst is suspected and accordingly raises the possibility
of a small inferior labral tear. Ganglion cyst is a differential
diagnostic consideration.

Bones:  Unremarkable except as noted above.
IMPRESSION: 1. Paralabral cyst or small ganglion cyst extending inferiorly from
the inferior labrum. This raises the possibility of a small inferior
labral tear, but a tear is not directly visualized.
2. Subtle teres minor edema could reflect low grade quadrilateral
space syndrome.
3. Minimal biceps tendinopathy.
4. Moderate degenerative AC joint arthropathy.

## 2018-12-12 ENCOUNTER — Other Ambulatory Visit: Payer: Self-pay

## 2018-12-12 DIAGNOSIS — Z20822 Contact with and (suspected) exposure to covid-19: Secondary | ICD-10-CM

## 2018-12-13 LAB — NOVEL CORONAVIRUS, NAA: SARS-CoV-2, NAA: DETECTED — AB

## 2021-09-23 ENCOUNTER — Other Ambulatory Visit (HOSPITAL_COMMUNITY): Payer: Self-pay | Admitting: Internal Medicine

## 2021-09-23 ENCOUNTER — Ambulatory Visit (HOSPITAL_COMMUNITY)
Admission: RE | Admit: 2021-09-23 | Discharge: 2021-09-23 | Disposition: A | Discharge status: Home / Self Care | Payer: PRIVATE HEALTH INSURANCE | Source: Ambulatory Visit | Attending: Internal Medicine | Admitting: Internal Medicine

## 2021-09-23 DIAGNOSIS — M79671 Pain in right foot: Secondary | ICD-10-CM | POA: Diagnosis present
